# Patient Record
Sex: Male | Born: 1965 | Race: White | Hispanic: No | Marital: Married | Smoking: Current every day smoker
Health system: Southern US, Community
[De-identification: ages and names within clinical notes are randomized; demographics above are authoritative.]

## PROBLEM LIST (undated history)

## (undated) DIAGNOSIS — J449 Chronic obstructive pulmonary disease, unspecified: Secondary | ICD-10-CM

## (undated) DIAGNOSIS — E7800 Pure hypercholesterolemia, unspecified: Secondary | ICD-10-CM

## (undated) DIAGNOSIS — I209 Angina pectoris, unspecified: Secondary | ICD-10-CM

## (undated) DIAGNOSIS — Z8739 Personal history of other diseases of the musculoskeletal system and connective tissue: Secondary | ICD-10-CM

## (undated) DIAGNOSIS — K219 Gastro-esophageal reflux disease without esophagitis: Secondary | ICD-10-CM

## (undated) DIAGNOSIS — F32A Depression, unspecified: Secondary | ICD-10-CM

## (undated) DIAGNOSIS — F99 Mental disorder, not otherwise specified: Secondary | ICD-10-CM

## (undated) DIAGNOSIS — Z87442 Personal history of urinary calculi: Secondary | ICD-10-CM

## (undated) DIAGNOSIS — F319 Bipolar disorder, unspecified: Secondary | ICD-10-CM

## (undated) DIAGNOSIS — I2510 Atherosclerotic heart disease of native coronary artery without angina pectoris: Secondary | ICD-10-CM

## (undated) DIAGNOSIS — M1990 Unspecified osteoarthritis, unspecified site: Secondary | ICD-10-CM

## (undated) DIAGNOSIS — R0600 Dyspnea, unspecified: Secondary | ICD-10-CM

## (undated) DIAGNOSIS — F329 Major depressive disorder, single episode, unspecified: Secondary | ICD-10-CM

## (undated) DIAGNOSIS — R519 Headache, unspecified: Secondary | ICD-10-CM

## (undated) DIAGNOSIS — J189 Pneumonia, unspecified organism: Secondary | ICD-10-CM

## (undated) DIAGNOSIS — M75102 Unspecified rotator cuff tear or rupture of left shoulder, not specified as traumatic: Secondary | ICD-10-CM

## (undated) HISTORY — DX: Pure hypercholesterolemia, unspecified: E78.00

## (undated) HISTORY — PX: CHOLECYSTECTOMY: SHX55

## (undated) HISTORY — PX: TOOTH EXTRACTION: SUR596

## (undated) HISTORY — DX: Atherosclerotic heart disease of native coronary artery without angina pectoris: I25.10

## (undated) HISTORY — PX: VASECTOMY: SHX75

## (undated) HISTORY — DX: Bipolar disorder, unspecified: F31.9

---

## 1999-01-21 ENCOUNTER — Encounter: Payer: Self-pay | Admitting: Emergency Medicine

## 1999-01-21 ENCOUNTER — Emergency Department (HOSPITAL_COMMUNITY): Admission: EM | Admit: 1999-01-21 | Discharge: 1999-01-21 | Payer: Self-pay | Admitting: Emergency Medicine

## 2008-06-16 DIAGNOSIS — J449 Chronic obstructive pulmonary disease, unspecified: Secondary | ICD-10-CM

## 2008-06-16 DIAGNOSIS — F17200 Nicotine dependence, unspecified, uncomplicated: Secondary | ICD-10-CM

## 2008-06-16 DIAGNOSIS — K219 Gastro-esophageal reflux disease without esophagitis: Secondary | ICD-10-CM

## 2008-06-16 DIAGNOSIS — J4489 Other specified chronic obstructive pulmonary disease: Secondary | ICD-10-CM | POA: Insufficient documentation

## 2008-06-16 DIAGNOSIS — N529 Male erectile dysfunction, unspecified: Secondary | ICD-10-CM | POA: Insufficient documentation

## 2008-06-16 HISTORY — DX: Other specified chronic obstructive pulmonary disease: J44.89

## 2008-06-16 HISTORY — DX: Gastro-esophageal reflux disease without esophagitis: K21.9

## 2008-06-16 HISTORY — DX: Male erectile dysfunction, unspecified: N52.9

## 2008-06-16 HISTORY — DX: Chronic obstructive pulmonary disease, unspecified: J44.9

## 2010-06-26 ENCOUNTER — Encounter: Payer: Self-pay | Admitting: Pulmonary Disease

## 2010-06-28 ENCOUNTER — Ambulatory Visit: Admit: 2010-06-28 | Payer: Self-pay | Admitting: Internal Medicine

## 2010-07-04 ENCOUNTER — Ambulatory Visit
Admission: RE | Admit: 2010-07-04 | Discharge: 2010-07-04 | Payer: Self-pay | Source: Home / Self Care | Attending: Pulmonary Disease | Admitting: Pulmonary Disease

## 2010-07-04 DIAGNOSIS — J438 Other emphysema: Secondary | ICD-10-CM

## 2010-07-04 DIAGNOSIS — R042 Hemoptysis: Secondary | ICD-10-CM | POA: Insufficient documentation

## 2010-07-04 HISTORY — DX: Hemoptysis: R04.2

## 2010-07-04 HISTORY — DX: Other emphysema: J43.8

## 2010-07-05 ENCOUNTER — Ambulatory Visit: Admit: 2010-07-05 | Payer: Self-pay | Admitting: Internal Medicine

## 2010-07-26 NOTE — Assessment & Plan Note (Signed)
Summary: consult for hemoptysis   Visit Type:  Initial Consult Copy to:  Gwendlyn Deutscher MD Primary Provider/Referring Provider:  Gwendlyn Deutscher MD  CC:  Pulmonary consult. Pt here for hemoptysis x 3 times. Pt states a fourth of his phlem had blood in it when he coughed. pt c/o dyspnea .  History of Present Illness: The pt is a 45y/o male who I have been asked to see for hemoptysis.  The pt states he noted a dark blood clump admixed with clear mucus for the first time about 2mos ago.  This occurred in small amts, and resolved after approx one week.  About a week later, occurred again one time, with the mucus appearing a little more yellow at this time.  He has not had any chest congestion, his cxr is unremarkable, and he denies any epistaxis or nasal symptoms.  He was treated with a short course of prednisone the first week of Jan.  He denies any weight loss or chest pain, but has had a little more sob the past 3mos.  He does smoke a ppd, and has been doing so since his teenage years.  Preventive Screening-Counseling & Management  Alcohol-Tobacco     Smoking Status: current  Current Medications (verified): 1)  Proventil Hfa 108 (90 Base) Mcg/act Aers (Albuterol Sulfate) .... 2 Puffs Every 4 Hours As Needed 2)  Spiriva Handihaler 18 Mcg Caps (Tiotropium Bromide Monohydrate) .... Inhale Contents of 1 Capsule Daily 3)  Protonix 40 Mg Tbec (Pantoprazole Sodium) .Marland Kitchen.. 1 Once Daily 4)  Lamictal 150 Mg Tabs (Lamotrigine) .Marland Kitchen.. 1 Once Daily 5)  Lithium Carbonate 300 Mg Caps (Lithium Carbonate) .Marland Kitchen.. 1 Once Daily 6)  Gabapentin 300 Mg Caps (Gabapentin) .Marland Kitchen.. 1 Once Daily 7)  Protonix 40 Mg Solr (Pantoprazole Sodium) .... Once Daily 8)  Colcrys 0.6 Mg Tabs (Colchicine) .Marland Kitchen.. 1 Two Times A Day 9)  Lamotrigine 150 Mg Tabs (Lamotrigine) .Marland Kitchen.. 1 Two Times A Day 10)  Lithium Carbonate 300 Mg Caps (Lithium Carbonate) .... 2 Two Times A Day 11)  Gabapentin 300 Mg Caps (Gabapentin) .Marland Kitchen.. 1 Three Times A Day 12)   Singulair 10 Mg Tabs (Montelukast Sodium) .... Once Daily 13)  Ventolin Hfa 108 (90 Base) Mcg/act Aers (Albuterol Sulfate) .... 3 Puffs Every 4 Hrs 14)  Albuterol Sulfate (2.5 Mg/50ml) 0.083% Nebu (Albuterol Sulfate) .... Once Daily 15)  Prednisone 10 Mg Tabs (Prednisone) .... 2 Two Times A Day  Allergies (verified): 1)  ! Motrin 2)  ! Nsaids 3)  ! Pcn  Past History:  Past Medical History:    TOBACCO ABUSE (ICD-305.1) ESOPHAGEAL REFLUX (ICD-530.81)  Chronic Headaches  Past Surgical History: Cholecystectomy  Family History: Reviewed history and no changes required. emphysema: mother allergies: mother asthma: sister heart disease: mgf clotting disorder: mother mgm: lung cancer great uncle: lung cancer great uncle: lung cancer  Social History: landscaper married Patient is a current smoker. 1 ppd. started age 61 3 children Smoking Status:  current  Review of Systems       The patient complains of shortness of breath with activity, shortness of breath at rest, non-productive cough, coughing up blood, chest pain, acid heartburn, headaches, anxiety, and depression.  The patient denies productive cough, irregular heartbeats, indigestion, loss of appetite, weight change, abdominal pain, difficulty swallowing, sore throat, tooth/dental problems, nasal congestion/difficulty breathing through nose, sneezing, itching, ear ache, hand/feet swelling, joint stiffness or pain, rash, change in color of mucus, and fever.    Vital Signs:  Patient profile:  45 year old male Height:      72 inches Weight:      180.50 pounds BMI:     24.57 O2 Sat:      100 % on Room air Temp:     98.3 degrees F oral Pulse rate:   64 / minute BP sitting:   120 / 70  (left arm) Cuff size:   large  Vitals Entered By: Carver Fila (July 04, 2010 9:15 AM)  O2 Flow:  Room air CC: Pulmonary consult. Pt here for hemoptysis x 3 times. Pt states a fourth of his phlem had blood in it when he coughed. pt  c/o dyspnea  Comments meds and allergies updated Phone number updated Mindy Silva  July 04, 2010 9:15 AM    Physical Exam  General:  wd male in nad Eyes:  PERRLA and EOMI.   Nose:  narrowed bilat, mildly inflammed but no purulence or blood Mouth:  no lesions or other abnormality seen no obvious bleeding source Neck:  no jvd, tmg, LN Lungs:  mild decrease in bs, no wheezing or rhonchi Heart:  rrr, no mrg Abdomen:  soft and nontender, bs+ Extremities:  no edema or cyanosis pulses intact distally Neurologic:  alert and oriented, moves all 4.   Impression & Recommendations:  Problem # 1:  HEMOPTYSIS UNSPECIFIED (ICD-786.30)  the pt has very mild hemoptysis by his description over the last few mos that has been intermittant in nature.  The most common causes of this are nasal/upper airway sources, and acute bronchitis.  He denies any sinus issues, and I do not see any nasal or OP bleeding currently.  He has not been treated with abx, and would like to go ahead and do this while he is completing his prednisone taper.  I have stressed to him the role of smoking on airway inflammation, and that no medicine will keep him well if he does not quit smoking.  I have also told him that cxr's can miss abnormalities in the lung and airways, and would do a ct chest with possible bronchoscopic airway exam if he has a recurrrence after being treated with antibiotics.  He is to let me know if this does occur.  Medications Added to Medication List This Visit: 1)  Protonix 40 Mg Solr (Pantoprazole sodium) .... Once daily 2)  Colcrys 0.6 Mg Tabs (Colchicine) .Marland Kitchen.. 1 two times a day 3)  Lamotrigine 150 Mg Tabs (Lamotrigine) .Marland Kitchen.. 1 two times a day 4)  Lithium Carbonate 300 Mg Caps (Lithium carbonate) .... 2 two times a day 5)  Gabapentin 300 Mg Caps (Gabapentin) .Marland Kitchen.. 1 three times a day 6)  Singulair 10 Mg Tabs (Montelukast sodium) .... Once daily 7)  Ventolin Hfa 108 (90 Base) Mcg/act Aers (Albuterol  sulfate) .... 3 puffs every 4 hrs 8)  Albuterol Sulfate (2.5 Mg/19ml) 0.083% Nebu (Albuterol sulfate) .... Once daily 9)  Prednisone 10 Mg Tabs (Prednisone) .... 2 two times a day 10)  Avelox 400 Mg Tabs (Moxifloxacin hcl) .... By mouth daily  Other Orders: Consultation Level IV (04540)  Patient Instructions: 1)  stop smoking!! this is the most important treatment 2)  finish up prednisone taper 3)  avelox 400mg  one a day for 5 days. 4)  If the bloody mucus does not resolve, or if it recurs in a few weeks, I need to hear from you.  Prescriptions: AVELOX 400 MG  TABS (MOXIFLOXACIN HCL) By mouth daily  #3 x 0   Entered and  Authorized by:   Barbaraann Share MD   Signed by:   Barbaraann Share MD on 07/04/2010   Method used:   Print then Give to Patient   RxID:   [id number]

## 2011-09-18 ENCOUNTER — Other Ambulatory Visit: Payer: Self-pay | Admitting: Orthopedic Surgery

## 2011-09-18 ENCOUNTER — Encounter (HOSPITAL_COMMUNITY): Payer: Self-pay | Admitting: Pharmacy Technician

## 2011-09-23 ENCOUNTER — Encounter (HOSPITAL_COMMUNITY): Payer: Self-pay | Admitting: *Deleted

## 2011-09-23 NOTE — Pre-Procedure Instructions (Signed)
20 Richard Sanford  09/23/2011   Your procedure is scheduled on:  09/26/11  Report to SHORT STAY DEPT  at 7:00 AM.  Call this number if you have problems the morning of surgery: [phone]   Remember:   Do not eat food or drink liquids AFTER MIDNIGHT    Take these medicines the morning of surgery with A SIP OF WATER: lamictal / lithium / protonix / albuterol inhaler if needed   Do not wear jewelry, make-up or nail polish.  Do not wear lotions, powders, or perfumes.   Do not shave legs or underarms 48 hrs. before surgery (men may shave face)  Do not bring valuables to the hospital.  Contacts, dentures or bridgework may not be worn into surgery.  Leave suitcase in the car. After surgery it may be brought to your room.  For patients admitted to the hospital, checkout time is 11:00 AM the day of discharge.   Patients discharged the day of surgery will not be allowed to drive home.  Name and phone number of your driver:   Special Instructions:   Please read over the following fact sheets that you were given: MRSA  Information               SHOWER WITH BETASEPT THE NIGHT BEFORE SURGERY AND THE MORNING OF SURGERY

## 2011-09-26 ENCOUNTER — Other Ambulatory Visit: Payer: Self-pay

## 2011-09-26 ENCOUNTER — Ambulatory Visit (HOSPITAL_COMMUNITY): Payer: BC Managed Care – PPO | Admitting: Anesthesiology

## 2011-09-26 ENCOUNTER — Ambulatory Visit (HOSPITAL_COMMUNITY): Payer: BC Managed Care – PPO

## 2011-09-26 ENCOUNTER — Encounter (HOSPITAL_COMMUNITY)
Admission: RE | Disposition: A | Discharge status: Home / Self Care | Payer: Self-pay | Source: Ambulatory Visit | Attending: Orthopedic Surgery

## 2011-09-26 ENCOUNTER — Encounter (HOSPITAL_COMMUNITY): Payer: Self-pay | Admitting: *Deleted

## 2011-09-26 ENCOUNTER — Encounter (HOSPITAL_COMMUNITY): Payer: Self-pay | Admitting: Anesthesiology

## 2011-09-26 ENCOUNTER — Ambulatory Visit (HOSPITAL_COMMUNITY)
Admission: RE | Admit: 2011-09-26 | Discharge: 2011-09-26 | Disposition: A | Discharge status: Home / Self Care | Payer: BC Managed Care – PPO | Source: Ambulatory Visit | Attending: Orthopedic Surgery | Admitting: Orthopedic Surgery

## 2011-09-26 DIAGNOSIS — Z01818 Encounter for other preprocedural examination: Secondary | ICD-10-CM | POA: Insufficient documentation

## 2011-09-26 DIAGNOSIS — J4489 Other specified chronic obstructive pulmonary disease: Secondary | ICD-10-CM | POA: Insufficient documentation

## 2011-09-26 DIAGNOSIS — Z01812 Encounter for preprocedural laboratory examination: Secondary | ICD-10-CM | POA: Insufficient documentation

## 2011-09-26 DIAGNOSIS — S43429A Sprain of unspecified rotator cuff capsule, initial encounter: Secondary | ICD-10-CM | POA: Insufficient documentation

## 2011-09-26 DIAGNOSIS — M25819 Other specified joint disorders, unspecified shoulder: Secondary | ICD-10-CM | POA: Insufficient documentation

## 2011-09-26 DIAGNOSIS — J449 Chronic obstructive pulmonary disease, unspecified: Secondary | ICD-10-CM | POA: Insufficient documentation

## 2011-09-26 DIAGNOSIS — R0602 Shortness of breath: Secondary | ICD-10-CM | POA: Insufficient documentation

## 2011-09-26 DIAGNOSIS — X58XXXA Exposure to other specified factors, initial encounter: Secondary | ICD-10-CM | POA: Insufficient documentation

## 2011-09-26 DIAGNOSIS — M75122 Complete rotator cuff tear or rupture of left shoulder, not specified as traumatic: Secondary | ICD-10-CM

## 2011-09-26 DIAGNOSIS — J3489 Other specified disorders of nose and nasal sinuses: Secondary | ICD-10-CM | POA: Insufficient documentation

## 2011-09-26 HISTORY — DX: Unspecified rotator cuff tear or rupture of left shoulder, not specified as traumatic: M75.102

## 2011-09-26 HISTORY — DX: Mental disorder, not otherwise specified: F99

## 2011-09-26 HISTORY — DX: Chronic obstructive pulmonary disease, unspecified: J44.9

## 2011-09-26 HISTORY — DX: Personal history of other diseases of the musculoskeletal system and connective tissue: Z87.39

## 2011-09-26 HISTORY — DX: Gastro-esophageal reflux disease without esophagitis: K21.9

## 2011-09-26 HISTORY — PX: SHOULDER OPEN ROTATOR CUFF REPAIR: SHX2407

## 2011-09-26 HISTORY — DX: Depression, unspecified: F32.A

## 2011-09-26 HISTORY — DX: Angina pectoris, unspecified: I20.9

## 2011-09-26 HISTORY — DX: Major depressive disorder, single episode, unspecified: F32.9

## 2011-09-26 LAB — CBC
HCT: 39.4 % (ref 39.0–52.0)
Hemoglobin: 13.2 g/dL (ref 13.0–17.0)
MCHC: 33.5 g/dL (ref 30.0–36.0)
MCV: 97.5 fL (ref 78.0–100.0)
RDW: 12.3 % (ref 11.5–15.5)

## 2011-09-26 LAB — SURGICAL PCR SCREEN: Staphylococcus aureus: POSITIVE — AB

## 2011-09-26 SURGERY — REPAIR, ROTATOR CUFF, OPEN
Anesthesia: General | Site: Shoulder | Laterality: Left | Wound class: Clean

## 2011-09-26 MED ORDER — ACETAMINOPHEN 10 MG/ML IV SOLN
INTRAVENOUS | Status: DC | PRN
  Administered 2011-09-26: 1000 mg via INTRAVENOUS

## 2011-09-26 MED ORDER — CEFAZOLIN SODIUM-DEXTROSE 2-3 GM-% IV SOLR
2.0000 g | INTRAVENOUS | Status: AC
  Administered 2011-09-26: 2 g via INTRAVENOUS

## 2011-09-26 MED ORDER — MUPIROCIN 2 % EX OINT
TOPICAL_OINTMENT | CUTANEOUS | Status: AC
  Filled 2011-09-26: qty 22

## 2011-09-26 MED ORDER — LIDOCAINE HCL (CARDIAC) 20 MG/ML IV SOLN
INTRAVENOUS | Status: DC | PRN
  Administered 2011-09-26: 50 mg via INTRAVENOUS

## 2011-09-26 MED ORDER — ACETAMINOPHEN 10 MG/ML IV SOLN
INTRAVENOUS | Status: AC
  Filled 2011-09-26: qty 100

## 2011-09-26 MED ORDER — PROMETHAZINE HCL 25 MG/ML IJ SOLN
6.2500 mg | INTRAMUSCULAR | Status: DC | PRN
  Administered 2011-09-26: 6.25 mg via INTRAVENOUS

## 2011-09-26 MED ORDER — BUPIVACAINE-EPINEPHRINE PF 0.25-1:200000 % IJ SOLN
INTRAMUSCULAR | Status: AC
  Filled 2011-09-26: qty 30

## 2011-09-26 MED ORDER — MUPIROCIN 2 % EX OINT: TOPICAL_OINTMENT | Freq: Two times a day (BID) | CUTANEOUS | Status: DC

## 2011-09-26 MED ORDER — 0.9 % SODIUM CHLORIDE (POUR BTL) OPTIME
TOPICAL | Status: DC | PRN
  Administered 2011-09-26: 1000 mL

## 2011-09-26 MED ORDER — PROPOFOL 10 MG/ML IV EMUL
INTRAVENOUS | Status: DC | PRN
  Administered 2011-09-26: 200 mg via INTRAVENOUS

## 2011-09-26 MED ORDER — NEOSTIGMINE METHYLSULFATE 1 MG/ML IJ SOLN
INTRAMUSCULAR | Status: DC | PRN
  Administered 2011-09-26: 4 mg via INTRAVENOUS

## 2011-09-26 MED ORDER — ROPIVACAINE HCL 5 MG/ML IJ SOLN
INTRAMUSCULAR | Status: DC | PRN
  Administered 2011-09-26: 30 mL

## 2011-09-26 MED ORDER — ROCURONIUM BROMIDE 100 MG/10ML IV SOLN
INTRAVENOUS | Status: DC | PRN
  Administered 2011-09-26: 40 mg via INTRAVENOUS

## 2011-09-26 MED ORDER — ONDANSETRON HCL 4 MG/2ML IJ SOLN
INTRAMUSCULAR | Status: DC | PRN
  Administered 2011-09-26: 4 mg via INTRAVENOUS

## 2011-09-26 MED ORDER — POVIDONE-IODINE 7.5 % EX SOLN: Freq: Once | CUTANEOUS | Status: DC

## 2011-09-26 MED ORDER — ROPIVACAINE HCL 5 MG/ML IJ SOLN
INTRAMUSCULAR | Status: AC
  Filled 2011-09-26: qty 30

## 2011-09-26 MED ORDER — MIDAZOLAM HCL 5 MG/5ML IJ SOLN
INTRAMUSCULAR | Status: DC | PRN
  Administered 2011-09-26: 2 mg via INTRAVENOUS

## 2011-09-26 MED ORDER — HYDROMORPHONE HCL PF 1 MG/ML IJ SOLN: 0.2500 mg | INTRAMUSCULAR | Status: DC | PRN

## 2011-09-26 MED ORDER — CEFAZOLIN SODIUM-DEXTROSE 2-3 GM-% IV SOLR
INTRAVENOUS | Status: AC
  Filled 2011-09-26: qty 50

## 2011-09-26 MED ORDER — PROMETHAZINE HCL 25 MG/ML IJ SOLN
INTRAMUSCULAR | Status: AC
  Filled 2011-09-26: qty 1

## 2011-09-26 MED ORDER — FENTANYL CITRATE 0.05 MG/ML IJ SOLN
INTRAMUSCULAR | Status: DC | PRN
  Administered 2011-09-26: 50 ug via INTRAVENOUS
  Administered 2011-09-26: 100 ug via INTRAVENOUS

## 2011-09-26 MED ORDER — TRAMADOL HCL 50 MG PO TABS: 50.0000 mg | ORAL_TABLET | Freq: Four times a day (QID) | ORAL | Status: AC | PRN

## 2011-09-26 MED ORDER — OXYCODONE-ACETAMINOPHEN 7.5-325 MG PO TABS: 1.0000 | ORAL_TABLET | ORAL | Status: AC | PRN

## 2011-09-26 MED ORDER — GLYCOPYRROLATE 0.2 MG/ML IJ SOLN
INTRAMUSCULAR | Status: DC | PRN
  Administered 2011-09-26: 0.6 mg via INTRAVENOUS

## 2011-09-26 MED ORDER — DEXAMETHASONE SODIUM PHOSPHATE 10 MG/ML IJ SOLN
INTRAMUSCULAR | Status: DC | PRN
  Administered 2011-09-26: 10 mg via INTRAVENOUS

## 2011-09-26 MED ORDER — LACTATED RINGERS IV SOLN
INTRAVENOUS | Status: DC | PRN
  Administered 2011-09-26 (×2): via INTRAVENOUS

## 2011-09-26 MED ORDER — METHOCARBAMOL 500 MG PO TABS: 500.0000 mg | ORAL_TABLET | Freq: Four times a day (QID) | ORAL | Status: AC

## 2011-09-26 MED ORDER — LACTATED RINGERS IV SOLN: INTRAVENOUS | Status: DC

## 2011-09-26 SURGICAL SUPPLY — 40 items
ANCHOR PEEK ZIP 5.5 NDL NO2 (Orthopedic Implant) ×2 IMPLANT
BAG ZIPLOCK 12X15 (MISCELLANEOUS) ×2 IMPLANT
BLADE OSCILLATING/SAGITTAL (BLADE) ×1
BLADE SW THK.38XMED LNG THN (BLADE) ×1 IMPLANT
BUR OVAL CARBIDE 4.0 (BURR) ×2 IMPLANT
CLOTH BEACON ORANGE TIMEOUT ST (SAFETY) ×2 IMPLANT
COVER SURGICAL LIGHT HANDLE (MISCELLANEOUS) IMPLANT
DECANTER SPIKE VIAL GLASS SM (MISCELLANEOUS) IMPLANT
DRAPE LG THREE QUARTER DISP (DRAPES) ×2 IMPLANT
DRAPE U-SHAPE 47X51 STRL (DRAPES) ×4 IMPLANT
DRSG EMULSION OIL 3X3 NADH (GAUZE/BANDAGES/DRESSINGS) ×2 IMPLANT
DRSG PAD ABDOMINAL 8X10 ST (GAUZE/BANDAGES/DRESSINGS) ×2 IMPLANT
DURAPREP 26ML APPLICATOR (WOUND CARE) ×2 IMPLANT
ELECT REM PT RETURN 9FT ADLT (ELECTROSURGICAL) ×2
ELECTRODE REM PT RTRN 9FT ADLT (ELECTROSURGICAL) ×1 IMPLANT
FACESHIELD LNG OPTICON STERILE (SAFETY) ×4 IMPLANT
GLOVE BIOGEL M 8.0 STRL (GLOVE) ×2 IMPLANT
GLOVE ECLIPSE 8.0 STRL XLNG CF (GLOVE) ×2 IMPLANT
GLOVE INDICATOR 8.0 STRL GRN (GLOVE) ×6 IMPLANT
GOWN PREVENTION PLUS XLARGE (GOWN DISPOSABLE) ×2 IMPLANT
GOWN STRL REIN XL XLG (GOWN DISPOSABLE) ×4 IMPLANT
KIT BASIN OR (CUSTOM PROCEDURE TRAY) ×2 IMPLANT
MANIFOLD NEPTUNE II (INSTRUMENTS) ×2 IMPLANT
NEEDLE MA TROC 1/2 (NEEDLE) ×2 IMPLANT
NS IRRIG 1000ML POUR BTL (IV SOLUTION) ×2 IMPLANT
PACK SHOULDER CUSTOM OPM052 (CUSTOM PROCEDURE TRAY) ×2 IMPLANT
PASSER SUT SWANSON 36MM LOOP (INSTRUMENTS) IMPLANT
POSITIONER SURGICAL ARM (MISCELLANEOUS) ×2 IMPLANT
SLING ARM IMMOBILIZER LRG (SOFTGOODS) ×2 IMPLANT
SPONGE GAUZE 4X4 12PLY (GAUZE/BANDAGES/DRESSINGS) ×2 IMPLANT
SPONGE LAP 4X18 X RAY DECT (DISPOSABLE) ×2 IMPLANT
SPONGE SURGIFOAM ABS GEL 100 (HEMOSTASIS) IMPLANT
STAPLER VISISTAT 35W (STAPLE) ×2 IMPLANT
SUT BONE WAX W31G (SUTURE) ×2 IMPLANT
SUT ETHIBOND NAB CT1 #1 30IN (SUTURE) ×6 IMPLANT
SUT VIC AB 1 CT1 27 (SUTURE) ×2
SUT VIC AB 1 CT1 27XBRD ANTBC (SUTURE) ×2 IMPLANT
SUT VIC AB 2-0 CT1 27 (SUTURE) ×2
SUT VIC AB 2-0 CT1 27XBRD (SUTURE) ×2 IMPLANT
TAPE CLOTH SURG 4X10 WHT LF (GAUZE/BANDAGES/DRESSINGS) ×2 IMPLANT

## 2011-09-26 NOTE — H&P (Signed)
Richard Sanford is an 46 y.o. male.   Chief Complaint: painful lt shoulder [id number] lt shoulder since early Feb with mri demonstrating rotator cuff tear with retraction  Past Medical History  Diagnosis Date  . Angina   . GERD (gastroesophageal reflux disease)   . Asthma   . Mental disorder     bipolar  . Depression   . Rotator cuff tear, left   . H/O: gout   . COPD (chronic obstructive pulmonary disease)     Past Surgical History  Procedure Date  . Cholecystectomy   . Vasectomy   . Tooth extraction     History reviewed. No pertinent family history. Social History:  reports that he has been smoking.  He does not have any smokeless tobacco history on file. He reports that he does not drink alcohol or use illicit drugs.  Allergies:  Allergies  Allergen Reactions  . Ibuprofen Other (See Comments)    Ulcers  . Nsaids Other (See Comments)    Ulcers   . Penicillins Swelling and Rash    Medications Prior to Admission  Medication Dose Route Frequency Provider Last Rate Last Dose  . ceFAZolin (ANCEF) IVPB 2 g/50 mL premix  2 g Intravenous 60 min Pre-Op Illene Labrador Jeany Seville, MD      . mupirocin ointment (BACTROBAN) 2 %   Nasal BID Illene Labrador Omolola Mittman, MD      . mupirocin ointment (BACTROBAN) 2 %           . povidone-iodine (BETADINE) 7.5 % scrub   Topical Once Drucilla Schmidt, MD       Medications Prior to Admission  Medication Sig Dispense Refill  . HYDROcodone-acetaminophen (NORCO) 5-325 MG per tablet Take 1 tablet by mouth every 6 (six) hours as needed.        Results for orders placed during the hospital encounter of 09/26/11 (from the past 48 hour(s))  CBC     Status: Abnormal   Collection Time   09/26/11  8:20 AM      Component Value Range Comment   WBC 4.1  4.0 - 10.5 (K/uL)    RBC 4.04 (*) 4.22 - 5.81 (MIL/uL)    Hemoglobin 13.2  13.0 - 17.0 (g/dL)    HCT 09.8  11.9 - 14.7 (%)    MCV 97.5  78.0 - 100.0 (fL)    MCH 32.7  26.0 - 34.0 (pg)    MCHC 33.5  30.0 -  36.0 (g/dL)    RDW 82.9  56.2 - 13.0 (%)    Platelets 198  150 - 400 (K/uL)    Dg Chest 2 View  09/26/2011  *RADIOLOGY REPORT*  Clinical Data: Preop evaluation for left shoulder surgery, shortness of breath and congestion  CHEST - 2 VIEW  Comparison:  None.  Findings:  The heart size and mediastinal contours are within normal limits.  Both lungs are clear.  The visualized skeletal structures are unremarkable.  IMPRESSION: No active cardiopulmonary disease.  Original Report Authenticated By: Judie Petit. TREVOR Miles Costain, M.D.    ROS  Blood pressure 124/74, pulse 69, temperature 97.2 F (36.2 C), temperature source Oral, resp. rate 18, height 6\' 2"  (1.88 m), weight 80.06 kg (176 lb 8 oz), SpO2 100.00%. Physical Exam  Constitutional: He is oriented to person, place, and time. He appears well-developed and well-nourished.  HENT:  Head: Normocephalic and atraumatic.  Right Ear: External ear normal.  Left Ear: External ear normal.  Nose: Nose normal.  Mouth/Throat: Oropharynx is clear  and moist.  Eyes: Conjunctivae and EOM are normal. Pupils are equal, round, and reactive to light.  Neck: Normal range of motion. Neck supple.  Cardiovascular: Normal rate, regular rhythm, normal heart sounds and intact distal pulses.   Respiratory: Effort normal and breath sounds normal.  GI: Soft. Bowel sounds are normal.  Musculoskeletal: Normal range of motion. He exhibits tenderness.       Diffuse tenderness about lt shoulder  Neurological: He is alert and oriented to person, place, and time. He has normal reflexes.  Skin: Skin is warm and dry.  Psychiatric: He has a normal mood and affect. His behavior is normal. Judgment and thought content normal.     Assessment/Plan Torn rotator cuff lt shoulder Anterior acromionectomy with repair torn rotator cuff Lulie Hurd P 09/26/2011, 9:26 AM

## 2011-09-26 NOTE — Anesthesia Preprocedure Evaluation (Addendum)
Anesthesia Evaluation  Patient identified by MRN, date of birth, ID band Patient awake    Reviewed: Allergy & Precautions, H&P , NPO status , Patient's Chart, lab work & pertinent test results  Airway Mallampati: II TM Distance: >3 FB Neck ROM: Full    Dental  (+) Edentulous Upper and Edentulous Lower   Pulmonary asthma , COPD COPD inhaler,  breath sounds clear to auscultation  + wheezing      Cardiovascular + angina Rhythm:Regular Rate:Normal     Neuro/Psych PSYCHIATRIC DISORDERS Depression negative neurological ROS     GI/Hepatic Neg liver ROS, GERD-  ,  Endo/Other  negative endocrine ROS  Renal/GU negative Renal ROS  negative genitourinary   Musculoskeletal negative musculoskeletal ROS (+)   Abdominal   Peds negative pediatric ROS (+)  Hematology negative hematology ROS (+)   Anesthesia Other Findings   Reproductive/Obstetrics negative OB ROS                         Anesthesia Physical Anesthesia Plan  ASA: III  Anesthesia Plan: General   Post-op Pain Management:    Induction: Intravenous  Airway Management Planned: Oral ETT  Additional Equipment:   Intra-op Plan:   Post-operative Plan: Extubation in OR  Informed Consent: I have reviewed the patients History and Physical, chart, labs and discussed the procedure including the risks, benefits and alternatives for the proposed anesthesia with the patient or authorized representative who has indicated his/her understanding and acceptance.   Dental advisory given  Plan Discussed with: CRNA  Anesthesia Plan Comments: (Discussed risks and benefits of interscalene block including failure, bleeding, infection, nerve damage, weakness. Questions answered. Patient consents to block. Discussed possible temporary dyspnea due to phrenic nerve block. Patient consents.)       Anesthesia Quick Evaluation

## 2011-09-26 NOTE — Brief Op Note (Signed)
09/26/2011  11:15 AM  PATIENT:  Richard Sanford  46 y.o. male  PRE-OPERATIVE DIAGNOSIS:  left shoulder rotator cuff tear  POST-OPERATIVE DIAGNOSIS:  left shoulder rotator cuff tear  PROCEDURE:  Procedure(s) (LRB): ROTATOR CUFF REPAIR SHOULDER OPEN (Left) with anterior acromionectomy  SURGEON:  Surgeon(s) and Role:    * Drucilla Schmidt, MD - Primary  PHYSICIAN ASSISTANT:   ASSISTANTS:Mr Idolina Primer [facility]  ANESTHESIA:   regional and general  EBL:  Total I/O In: 1200 [I.V.:1200] Out: 10 [Blood:10]  BLOOD ADMINISTERED:none  DRAINS: none   LOCAL MEDICATIONS USED:  NONE  SPECIMEN:  No Specimen  DISPOSITION OF SPECIMEN:  N/A  COUNTS:  YES  TOURNIQUET:  * No tourniquets in log *  DICTATION: .Other Dictation: Dictation Number [id number]  PLAN OF CARE: Discharge to home after PACU  PATIENT DISPOSITION:  PACU - hemodynamically stable.   Delay start of Pharmacological VTE agent (>24hrs) due to surgical blood loss or risk of bleeding: not applicable

## 2011-09-26 NOTE — Discharge Instructions (Signed)
Shoulder Arthroscopy Because the shoulder is one of the most mobile joints, it is more prone to injury. It is a very shallow ball and socket joint located between the large bone in your upper arm (humerus) and the shoulder blade (scapula). Arthroscopy is a valuable test for evaluating and treating injuries involving the shoulder joint. Arthroscopy is a surgical technique which uses small incisions (cuts by the surgeon) to insert a small telescope like instrument (arthroscope) and other tools into the shoulder. This allows the surgeon to look directly at the problem. When the arthroscope is in the joint, fluid is used to expand the joint space. This allows the surgeon to examine it more easily. The arthroscope then beams light into the joint and sends an image to a TV screen. As your surgeon examines your shoulder, he or she can also repair a number of problems found at the same time. Sometimes the procedure may change to an open surgery. This would happen if the problems are severe enough that they cannot be corrected with just arthroscopy. This is usually a very safe surgery. Rare complications include damage to nerves or blood vessels, excess bleeding, blood clots, infection, and rarely instrument failure. This is most often performed as a same day surgery. This means you will not have to stay in the hospital overnight. Recovery from this surgery is also much faster than having an open procedure. LET YOUR CAREGIVER KNOW ABOUT:  Allergies.   Medications taken including herbs, eye drops, over the counter medications, and creams.   Use of steroids (by mouth or creams).   Previous problems with anesthetics or novocaine.   Possibility of pregnancy, if this applies.   History of blood clots (thrombophlebitis).   History of bleeding or blood problems.   Previous surgery.   Other health problems.   Family history of anesthetic problems.  BEFORE THE PROCEDURE   Stop all anti-inflammatory  medications at least one week before surgery unless instructed otherwise. Tell your surgeon if you have been taking cortisone or other steroids.   Do not eat or drink after midnight or as instructed. Take medications as directed by your caregiver. You may have lab tests the morning of surgery.   You should be present 60 minutes prior to your procedure or as directed.  PROCEDURE  You may have general (go to sleep) or local (numb the area) anesthetic. Your surgeon will discuss this with you. During the procedure as discussed above, your surgeon may find a variety of problems which he or she can improve or correct using small instruments. When the procedure is finished the tiny incisions will be closed with stitches or tape. AFTER YOUR PROCEDURE  After surgery you will be taken to the recovery area. A nurse will watch and check your progress. Once you are awake, stable, and taking fluids well, barring other problems you will be allowed to go home.   Once home, apply an ice pack to your operative site for twenty minutes, three to four times per day, for two to three days. This may help with discomfort and keep the swelling down.   Use a sling and medications if prescribed or as instructed.   Unless your caregiver advises otherwise, move your arm and shoulder gently and frequently following the procedure. This can help prevent stiffness and swelling.  REHABILITATION  Almost as important as your surgery is your rehabilitation. If physical therapy and exercises are prescribed by your surgeon, follow them diligently. Once comfortable and on your way  to full use, do muscle strengthening exercises as instructed.   Only take over-the-counter or prescription medicines for pain, discomfort, or fever as directed by your caregiver.  SEEK IMMEDIATE MEDICAL CARE IF:   There is redness, swelling, or increasing pain in the wound or joint.   You notice purulent (colored- pus-like) drainage coming from the  wound.   An unexplained oral temperature above 102 F (38.9 C) develops.   You notice a foul smell coming from the wound or dressing.   There is a breaking open of the wound. The edges do not stay together after sutures or tape has been removed.   Persistent bleeding from the small incision.  Document Released: 06/07/2000 Document Revised: 05/30/2011 Document Reviewed: 09/26/2008 [facility] Patient Information 2012 [place], [place].  Call office for  1  Week appt. No drinking alcohol while taking pain meds. No driving today or until after MD follow up appt. Do not shower until after follow up appt. Take RX as prescribed

## 2011-09-26 NOTE — Preoperative (Signed)
Beta Blockers   Reason not to administer Beta Blockers:Not Applicable 

## 2011-09-26 NOTE — Anesthesia Procedure Notes (Signed)
Anesthesia Regional Block:  Interscalene brachial plexus block  Pre-Anesthetic Checklist: ,, timeout performed, Correct Patient, Correct Site, Correct Laterality, Correct Procedure, Correct Position, site marked, Risks and benefits discussed,  Surgical consent,  Pre-op evaluation,  At surgeon's request and post-op pain management  Laterality: Left  Prep: chloraprep       Needles:  Injection technique: Single-shot  Needle Type: Stimiplex      Needle Gauge: 21 G    Additional Needles:  Procedures: ultrasound guided and nerve stimulator Interscalene brachial plexus block  Nerve Stimulator or Paresthesia:  Response: 0.5 mA,   Additional Responses:   Narrative:   Performed by: Personally  Anesthesiologist: Xian Apostol  Additional Notes:  No pain on injection. No increased resistance to injection.  Motor intact immediately after block. Loss of deltoid function at 20 minutes.     Interscalene brachial plexus block

## 2011-09-26 NOTE — Anesthesia Postprocedure Evaluation (Signed)
  Anesthesia Post-op Note  Patient: Richard Sanford  Procedure(s) Performed: Procedure(s) (LRB): ROTATOR CUFF REPAIR SHOULDER OPEN (Left)  Patient Location: PACU  Anesthesia Type: GA combined with regional for post-op pain  Level of Consciousness: awake and alert   Airway and Oxygen Therapy: Patient Spontanous Breathing  Post-op Pain: mild  Post-op Assessment: Post-op Vital signs reviewed, Patient's Cardiovascular Status Stable, Respiratory Function Stable, Patent Airway and No signs of Nausea or vomiting  Post-op Vital Signs: stable  Complications: No apparent anesthesia complications

## 2011-09-26 NOTE — Transfer of Care (Signed)
Immediate Anesthesia Transfer of Care Note  Patient: Richard Sanford  Procedure(s) Performed: Procedure(s) (LRB): ROTATOR CUFF REPAIR SHOULDER OPEN (Left)  Patient Location: PACU  Anesthesia Type: General  Level of Consciousness: awake, alert  and sedated  Airway & Oxygen Therapy: Patient Spontanous Breathing and Patient connected to face mask oxygen  Post-op Assessment: Report given to PACU RN and Post -op Vital signs reviewed and stable  Post vital signs: Reviewed and stable  Complications: No apparent anesthesia complications

## 2011-09-27 NOTE — Op Note (Signed)
NAME:  Richard Sanford, Richard Sanford NO.:  [id number]  MEDICAL RECORD NO.:  [id number]  LOCATION:  [facility]                         FACILITY:  [facility]  PHYSICIAN:  Marlowe Kays, M.D.  DATE OF BIRTH:  1966-01-04  DATE OF PROCEDURE:  09/26/2011 DATE OF DISCHARGE:  09/26/2011                              OPERATIVE REPORT   PREOPERATIVE DIAGNOSIS:  Torn rotator cuff, left shoulder.  POSTOPERATIVE DIAGNOSIS:  Torn rotator cuff, left shoulder.  OPERATION:  Anterior acromionectomy and repair of torn rotator cuff, left shoulder.  SURGEON:  Marlowe Kays, M.D.  ASSISTANTDruscilla Brownie. Cherlynn June.  ANESTHESIA:  General preceded by interscalene block by anesthesiologist.  PATHOLOGY AND JUSTIFICATION FOR PROCEDURE:  Painful shoulder since early February with an MRI demonstrating a 15 x 18 mm full-thickness rotator cuff tear.  At surgery, it measured about this with 15 mm from front to back and 20 mm of retraction with some underneath lamination and additional retraction.  Mr. Angie Fava assistance was required to help hold and rotate the arm and assist in retraction.  PROCEDURE IN DETAIL:  Interscalene block by anesthesiologist, satisfied general anesthesia, beach-chair position on the sliding frame, left shoulder girdle was prepped with DuraPrep, draped in a sterile field. Ioban employed.  Time-out performed.  I made a vertical incision from roughly the [facility] joint down slightly past the acromion.  Incision was carried down to the underlying acromion and cutting cautery, I opened the fascia over it, dissecting it anteriorly and posteriorly to expose the anterior acromion.  I undermined it with a small Cobb elevator followed by larger Cobb elevator and I made my first of several passes with a micro saw to relieve the impingement.  He did have a significant impingement problem, and I kept removing underneath surface bone until the impingement problem was corrected.  The tear was  noted.  I roughened up the articular cartilage just superior to the greater tuberosity and I used a 5.5 Stryker 4 stranded anchor, which with the 4 strands spaced along the tear.  Also, because of the delamination, I retracted distally that is laterally, the underneath fibers as well, holding them with hemostat as I passed the sutures through full thicknesses of rotator cuff tear.  With the arm abducted, I made my initial tie-down of the strands and then made additional lateral tie-downs to the lateral humeral tissue, which gave a nice double layer smooth repair, which was stable with his arm to his side.  I irrigated the wound with sterile saline and closed this in 1 cm to 2 cm separation of the deltoid muscle with interrupted #1 Vicryl as well as the fascia over the anterior acromion with the same.  Subcutaneous tissue was closed with 2-0 Vicryl staples in the skin.  Betadine, Adaptic, dry sterile dressing, shoulder immobilizer applied.  He tolerated the procedure well, was taken to the recovery room in satisfactory condition with no known complications.  I did use prophylactic antibiotics.          ______________________________ Marlowe Kays, M.D.     JA/MEDQ  D:  09/26/2011  T:  09/27/2011  Job:  [id number]

## 2011-10-11 ENCOUNTER — Encounter (HOSPITAL_COMMUNITY): Payer: Self-pay | Admitting: Orthopedic Surgery

## 2013-08-04 IMAGING — CR DG CHEST 2V
2 series · 2 of 2 positions shown · non-contrast
Comparison: None.

CLINICAL DATA: Preop evaluation for left shoulder surgery,
shortness of breath and congestion

CHEST - 2 VIEW

[w chest pa]
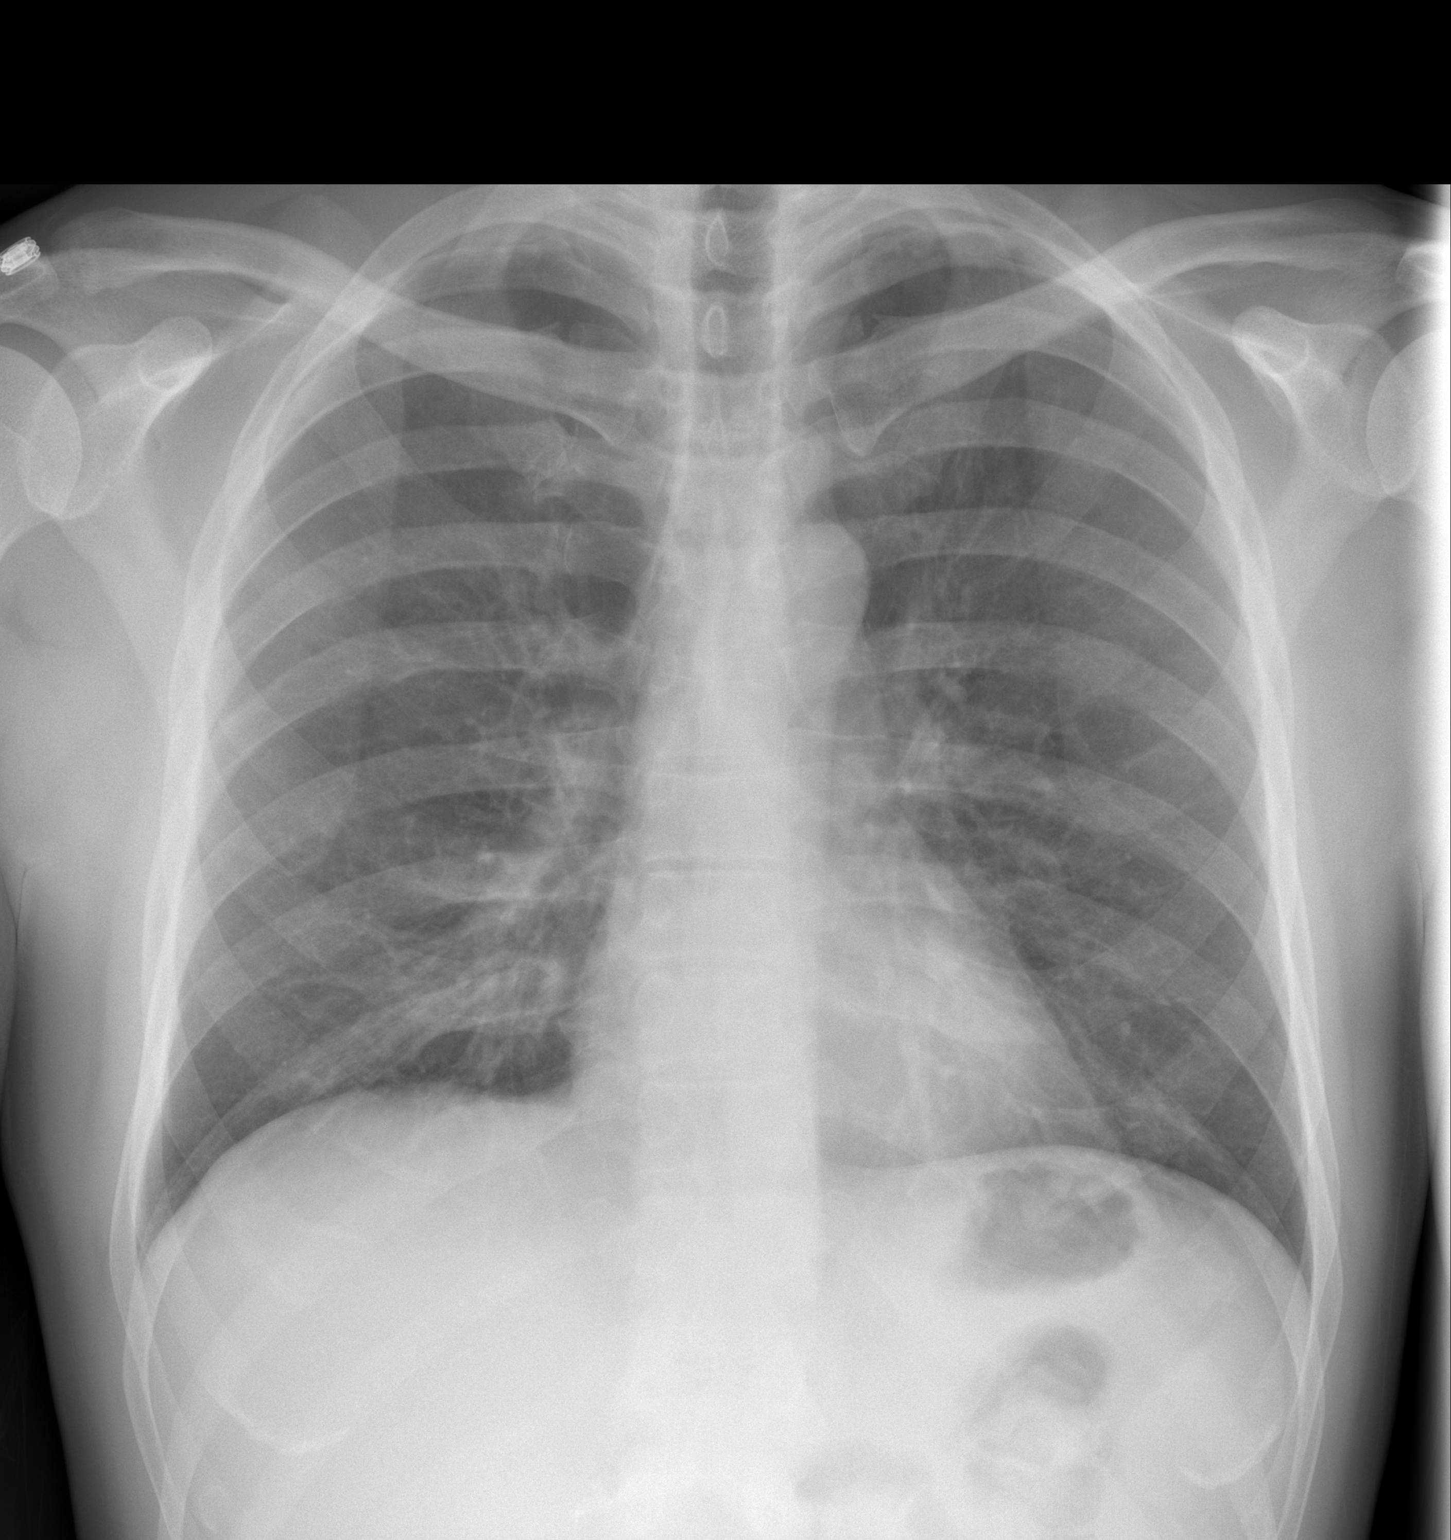

[w chest lat]
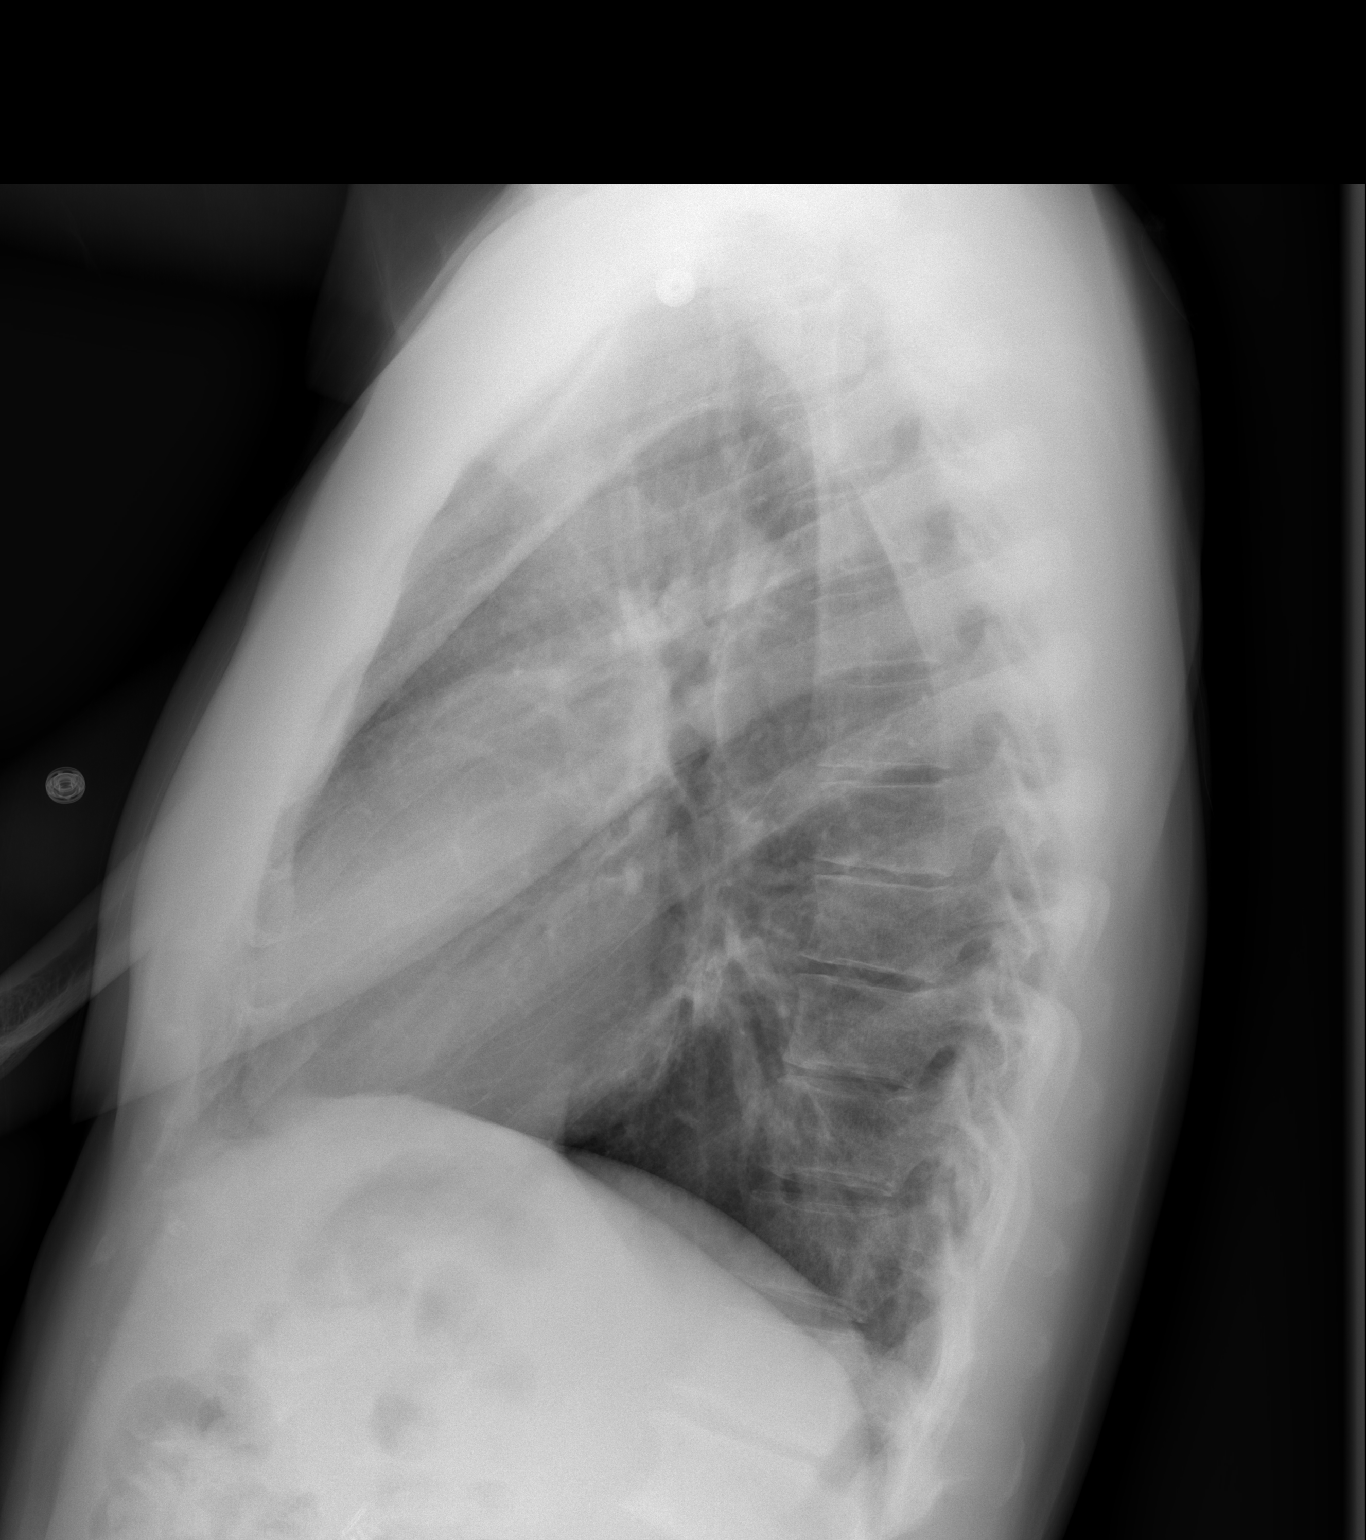

[2 of 2 positions shown; findings below may reference images not displayed]

FINDINGS: The heart size and mediastinal contours are within
normal limits.  Both lungs are clear.  The visualized skeletal
structures are unremarkable.
IMPRESSION: No active cardiopulmonary disease.

## 2014-12-01 DIAGNOSIS — R072 Precordial pain: Secondary | ICD-10-CM | POA: Insufficient documentation

## 2014-12-01 DIAGNOSIS — F3170 Bipolar disorder, currently in remission, most recent episode unspecified: Secondary | ICD-10-CM | POA: Insufficient documentation

## 2018-01-06 DIAGNOSIS — M5136 Other intervertebral disc degeneration, lumbar region: Secondary | ICD-10-CM | POA: Insufficient documentation

## 2018-02-04 ENCOUNTER — Other Ambulatory Visit: Payer: Self-pay | Admitting: Orthopedic Surgery

## 2018-02-04 DIAGNOSIS — M5136 Other intervertebral disc degeneration, lumbar region: Secondary | ICD-10-CM

## 2018-02-10 ENCOUNTER — Inpatient Hospital Stay
Admission: RE | Admit: 2018-02-10 | Discharge: 2018-02-10 | Disposition: A | Discharge status: Home / Self Care | Payer: Self-pay | Source: Ambulatory Visit | Attending: Orthopedic Surgery | Admitting: Orthopedic Surgery

## 2018-02-19 ENCOUNTER — Other Ambulatory Visit: Payer: Self-pay

## 2018-02-19 ENCOUNTER — Inpatient Hospital Stay
Admission: RE | Admit: 2018-02-19 | Discharge: 2018-02-19 | Disposition: A | Discharge status: Home / Self Care | Payer: Self-pay | Source: Ambulatory Visit | Attending: Orthopedic Surgery | Admitting: Orthopedic Surgery

## 2018-02-19 NOTE — Discharge Instructions (Signed)
Discogram Post Procedure Discharge Instructions ° °1. May resume a regular diet and any medications that you routinely take (including pain medications). °2. No driving day of procedure. °3. Upon discharge go home and rest for at least 4 hours.  May use an ice pack as needed to injection sites on back.  Ice to back 30 minutes on and 30 minutes off, all day. °4. May remove bandades later, today. °5. It is not unusual to be sore for several days after this procedure. ° ° ° °Please contact our office at [phone] for the following symptoms: ° °· Fever greater than 100 degrees °· Increased swelling, pain, or redness at injection site. ° ° °Thank you for visiting [facility]. ° ° °

## 2018-12-22 DIAGNOSIS — K2100 Gastro-esophageal reflux disease with esophagitis, without bleeding: Secondary | ICD-10-CM | POA: Insufficient documentation

## 2018-12-22 DIAGNOSIS — Z8669 Personal history of other diseases of the nervous system and sense organs: Secondary | ICD-10-CM | POA: Insufficient documentation

## 2018-12-22 DIAGNOSIS — Z8711 Personal history of peptic ulcer disease: Secondary | ICD-10-CM | POA: Insufficient documentation

## 2019-10-25 DIAGNOSIS — R0600 Dyspnea, unspecified: Secondary | ICD-10-CM

## 2021-09-24 ENCOUNTER — Encounter: Payer: Self-pay | Admitting: Cardiology

## 2021-10-11 ENCOUNTER — Encounter: Payer: Self-pay | Admitting: *Deleted

## 2021-10-11 ENCOUNTER — Encounter: Payer: Self-pay | Admitting: Cardiology

## 2021-10-26 ENCOUNTER — Ambulatory Visit: Payer: Managed Care, Other (non HMO) | Admitting: Cardiology

## 2021-10-26 ENCOUNTER — Encounter: Payer: Self-pay | Admitting: Cardiology

## 2021-10-26 VITALS — BP 128/70 | HR 57 | Ht 74.0 in | Wt 166.4 lb

## 2021-10-26 DIAGNOSIS — E785 Hyperlipidemia, unspecified: Secondary | ICD-10-CM | POA: Insufficient documentation

## 2021-10-26 DIAGNOSIS — J438 Other emphysema: Secondary | ICD-10-CM

## 2021-10-26 DIAGNOSIS — N529 Male erectile dysfunction, unspecified: Secondary | ICD-10-CM | POA: Diagnosis not present

## 2021-10-26 DIAGNOSIS — F3170 Bipolar disorder, currently in remission, most recent episode unspecified: Secondary | ICD-10-CM | POA: Diagnosis not present

## 2021-10-26 DIAGNOSIS — R072 Precordial pain: Secondary | ICD-10-CM | POA: Diagnosis not present

## 2021-10-26 MED ORDER — ROSUVASTATIN CALCIUM 10 MG PO TABS: 10.0000 mg | ORAL_TABLET | Freq: Every day | ORAL | 0 refills | Status: DC

## 2021-10-26 MED ORDER — NITROGLYCERIN 0.4 MG SL SUBL: 0.4000 mg | SUBLINGUAL_TABLET | SUBLINGUAL | 6 refills | Status: DC | PRN

## 2021-10-26 MED ORDER — ASPIRIN EC 81 MG PO TBEC: 81.0000 mg | DELAYED_RELEASE_TABLET | Freq: Every day | ORAL | 3 refills | Status: DC

## 2021-10-26 MED ORDER — METOPROLOL TARTRATE 25 MG PO TABS: 25.0000 mg | ORAL_TABLET | Freq: Two times a day (BID) | ORAL | 0 refills | Status: DC

## 2021-10-26 NOTE — H&P (View-Only) (Signed)
? ?Cardiology Consultation:   ? ?Date:  10/26/2021  ? ?ID:  Richard Sanford, DOB 05/16/1966, MRN [id number] ? ?PCP:  Myrlene Broker, MD  ?Cardiologist:  Jenne Campus, MD  ? ?Referring MD: Donia Ast, [place]  ? ?Chief Complaint  ?Patient presents with  ? Abnormal stress test   ? Chest Pain  ? ? ?History of Present Illness:   ? ?Richard Sanford is a 56 y.o. male who is being seen today for the evaluation of chest pain at the request of Donia Ast, [place].  He is a chronic smoker to be smoking all his life however now she is trying to quit, also have bipolar disorder which is stable on lithium, also dyslipidemia with low HDL.  He was referred to [place] because of chest pain.  He described to have tightness in the chest and happen when he tried to do something that been going on for about 3 months became a little more frequent and lasting a bit longer lately.  That sensation is typically associated with shortness of breath as well with some sweating.  That forced him to slow down overall.  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  He was scheduled to have a stress test, stress test that was done in [facility] stress to show some possibility of old inferior septal MI without evidence of ischemia. ?He does not exercise on the regular basis, he is not on any special diet he does have family history of premature coronary artery disease apparently his grandfather had first heart attack at his age. ? ?Past Medical History:  ?Diagnosis Date  ? Angina   ? Asthma   ? Bipolar 1 disorder ([place])   ? CAD (coronary artery disease)   ? CHRONIC AIRWAY OBSTRUCTION NEC 06/16/2008  ? Qualifier: Diagnosis of  By: Tilden Dome    ? COPD (chronic obstructive pulmonary disease) ([place])   ? Depression   ? EMPHYSEMA 07/04/2010  ? Qualifier: Diagnosis of  By: Charma Igo    ? Esophageal reflux 06/16/2008  ? Qualifier: Diagnosis of  By: Tilden Dome    ? GERD  (gastroesophageal reflux disease)   ? H/O: gout   ? HEMOPTYSIS UNSPECIFIED 07/04/2010  ? Qualifier: Diagnosis of  By: Gwenette Greet MD, Armando Reichert   ? Hypercholesterolemia   ? 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 Qualifier: Diagnosis of  By: Tilden Dome    ? Mental disorder   ? bipolar  ? Rotator cuff tear, left   ? ? ?Past Surgical History:  ?Procedure Laterality Date  ? CHOLECYSTECTOMY    ? SHOULDER OPEN ROTATOR CUFF REPAIR  09/26/2011  ? Procedure: ROTATOR CUFF REPAIR SHOULDER OPEN;  Surgeon: Magnus Sinning, MD;  Location: [facility];  Service: Orthopedics;  Laterality: Left;  Open Left Shoulder Anterior Acrominectomy and Rotator Cuff Repair  ? TOOTH EXTRACTION    ? VASECTOMY    ? ? ?Current Medications: ?Current Meds  ?Medication Sig  ? gabapentin (NEURONTIN) 300 MG capsule Take 600 mg by mouth 2 (two) times daily.  ? lamoTRIgine (LAMICTAL) 200 MG tablet Take 200 mg by mouth 2 (two) times daily.  ? lithium carbonate 300 MG capsule Take 600 mg by mouth 2 (two) times daily with a meal.  ? pantoprazole (PROTONIX) 40 MG tablet Take 40 mg by mouth daily.  ? tadalafil (CIALIS) 20 MG tablet Take 20 mg by mouth  daily as needed for erectile dysfunction.  ?  ? ?Allergies:   Penicillins, Nsaids, and Tolmetin  ? ?Social History  ? ?Socioeconomic History  ? Marital status: Single  ?  Spouse name: Not on file  ? Number of children: Not on file  ? Years of education: Not on file  ? Highest education level: Not on file  ?Occupational History  ? Not on file  ?Tobacco Use  ? Smoking status: Every Day  ?  Packs/day: 1.00  ?  Types: Cigarettes  ? Smokeless tobacco: Not on file  ?Substance and Sexual Activity  ? Alcohol use: No  ? Drug use: No  ? Sexual activity: Not on file  ?Other Topics Concern  ? Not on file  ?Social History Narrative  ? Not on file  ? ?Social Determinants of Health  ? ?Financial Resource Strain: Not on file  ?Food Insecurity: Not on file  ?Transportation Needs: Not on file  ?Physical Activity: Not on file  ?Stress:  Not on file  ?Social Connections: Not on file  ?  ? ?Family History: ?The patient's family history includes Asthma in his mother; Heart disease in his maternal grandfather. ?ROS:   ?Please see the history of present illness.    ?All 14 point review of systems negative except as described per history of present illness. ? ?EKGs/Labs/Other Studies Reviewed:   ? ?The following studies were reviewed today: ?Stress test reviewed from the hospital report says fixed defect inferior septal wall no ischemia. ? ?EKG:  EKG is  ordered today.  The ekg ordered today demonstrates normal sinus rhythm normal P interval normal QS complex duration fulgent no ST segment changes ? ?Recent Labs: ?No results found for requested labs within last 8760 hours.  ?Recent Lipid Panel ?No results found for: CHOL, TRIG, HDL, CHOLHDL, VLDL, LDLCALC, LDLDIRECT ? ?Physical Exam:   ? ?VS:  BP 128/70 (BP Location: Left Arm, Patient Position: Sitting)   Pulse (!) 57   Ht 6\' 2"  (1.88 m)   Wt 166 lb 6.4 oz (75.5 kg)   SpO2 98%   BMI 21.36 kg/m?    ? ?Wt Readings from Last 3 Encounters:  ?10/26/21 166 lb 6.4 oz (75.5 kg)  ?09/28/21 167 lb (75.8 kg)  ?09/26/11 176 lb 8 oz (80.1 kg)  ?  ? ?GEN:  Well nourished, well developed in no acute distress ?HEENT: Normal ?NECK: No JVD; No carotid bruits ?LYMPHATICS: No lymphadenopathy ?CARDIAC: RRR, no murmurs, no rubs, no gallops ?RESPIRATORY:  Clear to auscultation without rales, wheezing or rhonchi  ?ABDOMEN: Soft, non-tender, non-distended ?MUSCULOSKELETAL:  No edema; No deformity  ?SKIN: Warm and dry ?NEUROLOGIC:  Alert and oriented x 3 ?PSYCHIATRIC:  Normal affect  ? ?ASSESSMENT:   ? ?1. Erectile dysfunction, unspecified erectile dysfunction type   ?2. Dyslipidemia   ?3. Precordial pain   ?4. Bipolar affective disorder in remission [facility])   ?5. EMPHYSEMA   ? ?PLAN:   ? ?In order of problems listed above: ? ?Evaluated suspicious story for typical angina pectoris.  He described a situation that this pain gets  more frequent and lasting longer even a few episodes that happen at rest did not have any recent resting chest pain.  He does have numerous risk factors for coronary artery disease which include low HDL, smoking which is still ongoing, family history of premature coronary artery disease.  Stress test done in the hospital surprising show evidence of old myocardial infarction however EKG did not confirm that.  I think  we have enough for some symptoms and enough risk factors to proceed directly to cardiac catheterization to clarify make sure he does not have significant obstructive disease.  He tells me that he had cardiac catheterization done 10 to 15 years ago and apparently everything was okay at that time.  In the meantime I asked him to start taking 1 baby aspirin every single day, will give him prescription for metoprolol twice daily, will give him also nitroglycerin.  When asking not to take Cialis until we have situation clear.  I will also initiate therapy with statin.  We will put him on Crestor 10 mg daily.  I described cardiac catheterization to him including all risk benefits as well as alternatives.  We will schedule him to have cardiac catheterization done ?2. Dyslipidemia we will start him on Crestor 10 ?3.  Bipolar disorder that being stable ?4.  Smoking obviously we had discussion about this I strongly recommended to quit ? ? ?Medication Adjustments/Labs and Tests Ordered: ?Current medicines are reviewed at length with the patient today.  Concerns regarding medicines are outlined above.  ?No orders of the defined types were placed in this encounter. ? ?No orders of the defined types were placed in this encounter. ? ? ?Signed, ?Park Liter, MD, [facility]. ?10/26/2021 3:35 PM    ?[place] ? ?

## 2021-10-26 NOTE — Progress Notes (Addendum)
? ?Cardiology Consultation:   ? ?Date:  10/26/2021  ? ?ID:  Richard Sanford, DOB 1965/11/06, MRN [id number] ? ?PCP:  Myrlene Broker, MD  ?Cardiologist:  Jenne Campus, MD  ? ?Referring MD: Donia Ast, [place]  ? ?Chief Complaint  ?Patient presents with  ? Abnormal stress test   ? Chest Pain  ? ? ?History of Present Illness:   ? ?Richard Sanford is a 56 y.o. male who is being seen today for the evaluation of chest pain at the request of Donia Ast, [place].  He is a chronic smoker to be smoking all his life however now she is trying to quit, also have bipolar disorder which is stable on lithium, also dyslipidemia with low HDL.  He was referred to [place] because of chest pain.  He described to have tightness in the chest and happen when he tried to do something that been going on for about 3 months became a little more frequent and lasting a bit longer lately.  That sensation is typically associated with shortness of breath as well with some sweating.  That forced him to slow down overall.  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  He was scheduled to have a stress test, stress test that was done in [facility] stress to show some possibility of old inferior septal MI without evidence of ischemia. ?He does not exercise on the regular basis, he is not on any special diet he does have family history of premature coronary artery disease apparently his grandfather had first heart attack at his age. ? ?Past Medical History:  ?Diagnosis Date  ? Angina   ? Asthma   ? Bipolar 1 disorder ([place])   ? CAD (coronary artery disease)   ? CHRONIC AIRWAY OBSTRUCTION NEC 06/16/2008  ? Qualifier: Diagnosis of  By: Tilden Dome    ? COPD (chronic obstructive pulmonary disease) ([place])   ? Depression   ? EMPHYSEMA 07/04/2010  ? Qualifier: Diagnosis of  By: Charma Igo    ? Esophageal reflux 06/16/2008  ? Qualifier: Diagnosis of  By: Tilden Dome    ? GERD  (gastroesophageal reflux disease)   ? H/O: gout   ? HEMOPTYSIS UNSPECIFIED 07/04/2010  ? Qualifier: Diagnosis of  By: Gwenette Greet MD, Armando Reichert   ? Hypercholesterolemia   ? 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 Qualifier: Diagnosis of  By: Tilden Dome    ? Mental disorder   ? bipolar  ? Rotator cuff tear, left   ? ? ?Past Surgical History:  ?Procedure Laterality Date  ? CHOLECYSTECTOMY    ? SHOULDER OPEN ROTATOR CUFF REPAIR  09/26/2011  ? Procedure: ROTATOR CUFF REPAIR SHOULDER OPEN;  Surgeon: Magnus Sinning, MD;  Location: [facility];  Service: Orthopedics;  Laterality: Left;  Open Left Shoulder Anterior Acrominectomy and Rotator Cuff Repair  ? TOOTH EXTRACTION    ? VASECTOMY    ? ? ?Current Medications: ?Current Meds  ?Medication Sig  ? gabapentin (NEURONTIN) 300 MG capsule Take 600 mg by mouth 2 (two) times daily.  ? lamoTRIgine (LAMICTAL) 200 MG tablet Take 200 mg by mouth 2 (two) times daily.  ? lithium carbonate 300 MG capsule Take 600 mg by mouth 2 (two) times daily with a meal.  ? pantoprazole (PROTONIX) 40 MG tablet Take 40 mg by mouth daily.  ? tadalafil (CIALIS) 20 MG tablet Take 20 mg by mouth  daily as needed for erectile dysfunction.  ?  ? ?Allergies:   Penicillins, Nsaids, and Tolmetin  ? ?Social History  ? ?Socioeconomic History  ? Marital status: Single  ?  Spouse name: Not on file  ? Number of children: Not on file  ? Years of education: Not on file  ? Highest education level: Not on file  ?Occupational History  ? Not on file  ?Tobacco Use  ? Smoking status: Every Day  ?  Packs/day: 1.00  ?  Types: Cigarettes  ? Smokeless tobacco: Not on file  ?Substance and Sexual Activity  ? Alcohol use: No  ? Drug use: No  ? Sexual activity: Not on file  ?Other Topics Concern  ? Not on file  ?Social History Narrative  ? Not on file  ? ?Social Determinants of Health  ? ?Financial Resource Strain: Not on file  ?Food Insecurity: Not on file  ?Transportation Needs: Not on file  ?Physical Activity: Not on file  ?Stress:  Not on file  ?Social Connections: Not on file  ?  ? ?Family History: ?The patient's family history includes Asthma in his mother; Heart disease in his maternal grandfather. ?ROS:   ?Please see the history of present illness.    ?All 14 point review of systems negative except as described per history of present illness. ? ?EKGs/Labs/Other Studies Reviewed:   ? ?The following studies were reviewed today: ?Stress test reviewed from the hospital report says fixed defect inferior septal wall no ischemia. ? ?EKG:  EKG is  ordered today.  The ekg ordered today demonstrates normal sinus rhythm normal P interval normal QS complex duration fulgent no ST segment changes ? ?Recent Labs: ?No results found for requested labs within last 8760 hours.  ?Recent Lipid Panel ?No results found for: CHOL, TRIG, HDL, CHOLHDL, VLDL, LDLCALC, LDLDIRECT ? ?Physical Exam:   ? ?VS:  BP 128/70 (BP Location: Left Arm, Patient Position: Sitting)   Pulse (!) 57   Ht 6\' 2"  (1.88 m)   Wt 166 lb 6.4 oz (75.5 kg)   SpO2 98%   BMI 21.36 kg/m?    ? ?Wt Readings from Last 3 Encounters:  ?10/26/21 166 lb 6.4 oz (75.5 kg)  ?09/28/21 167 lb (75.8 kg)  ?09/26/11 176 lb 8 oz (80.1 kg)  ?  ? ?GEN:  Well nourished, well developed in no acute distress ?HEENT: Normal ?NECK: No JVD; No carotid bruits ?LYMPHATICS: No lymphadenopathy ?CARDIAC: RRR, no murmurs, no rubs, no gallops ?RESPIRATORY:  Clear to auscultation without rales, wheezing or rhonchi  ?ABDOMEN: Soft, non-tender, non-distended ?MUSCULOSKELETAL:  No edema; No deformity  ?SKIN: Warm and dry ?NEUROLOGIC:  Alert and oriented x 3 ?PSYCHIATRIC:  Normal affect  ? ?ASSESSMENT:   ? ?1. Erectile dysfunction, unspecified erectile dysfunction type   ?2. Dyslipidemia   ?3. Precordial pain   ?4. Bipolar affective disorder in remission [facility])   ?5. EMPHYSEMA   ? ?PLAN:   ? ?In order of problems listed above: ? ?Suspicious story for typical angina pectoris.  He described a situation that this pain gets more  frequent and lasting longer even a few episodes that happen at rest did not have any recent resting chest pain.  He does have numerous risk factors for coronary artery disease which include low HDL, smoking which is still ongoing, family history of premature coronary artery disease.  Stress test done in the hospital surprising show evidence of old myocardial infarction however EKG did not confirm that.  I think we  have enough for some symptoms and enough risk factors to proceed directly to cardiac catheterization to clarify make sure he does not have significant obstructive disease.  He tells me that he had cardiac catheterization done 10 to 15 years ago and apparently everything was okay at that time.  In the meantime I asked him to start taking 1 baby aspirin every single day, will give him prescription for metoprolol twice daily, will give him also nitroglycerin.  When asking not to take Cialis until we have situation clear.  I will also initiate therapy with statin.  We will put him on Crestor 10 mg daily.  I described cardiac catheterization to him including all risk benefits as well as alternatives.  We will schedule him to have cardiac catheterization done ?2. Dyslipidemia we will start him on Crestor 10 ?3.  Bipolar disorder that being stable ?4.  Smoking obviously we had discussion about this I strongly recommended to quit ? ? ?Medication Adjustments/Labs and Tests Ordered: ?Current medicines are reviewed at length with the patient today.  Concerns regarding medicines are outlined above.  ?No orders of the defined types were placed in this encounter. ? ?No orders of the defined types were placed in this encounter. ? ? ?Signed, ?Park Liter, MD, [facility]. ?10/26/2021 3:35 PM    ?[place] ? ?

## 2021-10-26 NOTE — Patient Instructions (Signed)
Medication Instructions:  ?START:  ?Enteric Coated Baby Aspirin 81mg  1 tablet every day by mouth ? ?Metoprolol 25mg  1 tablet twice daily by mouth ? ?Nitrogylcerin ?Use nitroglycerin 1 tablet placed under the tongue at the first sign of chest pain or an angina attack. 1 tablet may be used every 5 minutes as needed, for up to 15 minutes. Do not take more than 3 tablets in 15 minutes. If pain persist call 911 or go to the nearest ED.  ? ?Crestor 10mg  1 tablet by mouth every day ? ? ?STOP: Cialis ? ? ?  ? ?*If you need a refill on your cardiac medications before your next appointment, please call your pharmacy* ? ? ?Lab Work: ?BMP And CBC Prior to Procedure. ? ?If you have labs (blood work) drawn today and your tests are completely normal, you will receive your results only by: ?MyChart Message (if you have MyChart) OR ?A paper copy in the mail ?If you have any lab test that is abnormal or we need to change your treatment, we will call you to review the results. ? ? ?Testing/Procedures: ? ?[facility] ?[facility] [place] ?[address] ?[place] [id number] ?Dept: [phone] ?Loc: [phone]: [phone] ? ?Richard PotashWilliam T Sanford  10/26/2021 ? ?You are scheduled for a Cardiac Catheterization on Thursday, May 11 with Dr. Cristal Deerhristopher End. ? ?1. Please arrive at the Main Entrance A at [facility]: [address] [place], [place] [address] at 9:30 AM (This time is two hours before your procedure to ensure your preparation). Free valet parking service is available.  ? ?Special note: Every effort is made to have your procedure done on time. Please understand that emergencies sometimes delay scheduled procedures. ? ?2. Diet: Do not eat solid foods after midnight.  You may have clear liquids until 5 AM upon the day of the procedure. ? ?3. Labs: You will need to have blood drawn on Monday, May 8 at [facility]: [address], [place] . You do not need to be fasting. ? ?4.  Medication instructions in preparation for your procedure: ? ? Contrast Allergy: No ? ? ?On the morning of your procedure, take Aspirin and any morning medicines NOT listed above.  You may use sips of water. ? ?5. Plan to go home the same day, you will only stay overnight if medically necessary. ?6. You MUST have a responsible adult to drive you home. ?7. An adult MUST be with you the first 24 hours after you arrive home. ?8. Bring a current list of your medications, and the last time and date medication taken. ?9. Bring ID and current insurance cards. ?10.Please wear clothes that are easy to get on and off and wear slip-on shoes. ? ?Thank you for allowing us to care for you! ?  -- [facility]  ? ? ?Follow-Up: ?At [facility], you and your health needs are our priority.  As part of our continuing mission to provide you with exceptional heart care, we have created designated Provider Care Teams.  These Care Teams include your primary Cardiologist (physician) and Advanced Practice Providers (APPs -  Physician Assistants and Nurse Practitioners) who all work together to provide you with the care you need, when you need it. ? ?We recommend signing up for the patient portal called "MyChart".  Sign up information is provided on this After Visit Summary.  MyChart is used to connect with patients for Virtual Visits (Telemedicine).  Patients are able to view lab/test results, encounter notes, upcoming appointments, etc.  Non-urgent messages can be sent to your provider as well.   ?To learn more about what you can do with MyChart, go to [email or web address].   ? ?Your next appointment:   ?6 week(s) ? ?The format for your next appointment:   ?In Person ? ?Provider:   ?Gypsy Balsam, MD ? ? ?Other Instructions ? ?Coronary Angiogram With Stent ?Coronary angiogram with stent placement is a procedure to widen or open a narrow blood vessel of the heart (coronary artery). Arteries may become  blocked by cholesterol buildup (plaques) in the lining of the artery wall. When a coronary artery becomes partially blocked, blood flow to that area decreases. This may lead to chest pain or a heart attack (myocardial infarction). ?A stent is a small piece of metal that looks like mesh or spring. Stent placement may be done as treatment after a heart attack, or to prevent a heart attack if a blocked artery is found by a coronary angiogram. ?Let your health care provider know about: ?Any allergies you have, including allergies to medicines or contrast dye. ?All medicines you are taking, including vitamins, herbs, eye drops, creams, and over-the-counter medicines. ?Any problems you or family members have had with anesthetic medicines. ?Any blood disorders you have. ?Any surgeries you have had. ?Any medical conditions you have, including kidney problems or kidney failure. ?Whether you are pregnant or may be pregnant. ?Whether you are breastfeeding. ?What are the risks? ?Generally, this is a safe procedure. However, serious problems may occur, including: ?Damage to nearby structures or organs, such as the heart, blood vessels, or kidneys. ?A return of blockage. ?Bleeding, infection, or bruising at the insertion site. ?A collection of blood under the skin (hematoma) at the insertion site. ?A blood clot in another part of the body. ?Allergic reaction to medicines or dyes. ?Bleeding into the abdomen (retroperitoneal bleeding). ?Stroke (rare). ?Heart attack (rare). ?What happens before the procedure? ?Staying hydrated ?Follow instructions from your health care provider about hydration, which may include: ?Up to 2 hours before the procedure - you may continue to drink clear liquids, such as water, clear fruit juice, black coffee, and plain tea.  ?  ?Eating and drinking restrictions ?Follow instructions from your health care provider about eating and drinking, which may include: ?8 hours before the procedure - stop eating  heavy meals or foods, such as meat, fried foods, or fatty foods. ?6 hours before the procedure - stop eating light meals or foods, such as toast or cereal. ?2 hours before the procedure - stop drinking clear liquids. ?Medicines ?Ask your health care provider about: ?Changing or stopping your regular medicines. This is especially important if you are taking diabetes medicines or blood thinners. ?Taking medicines such as aspirin and ibuprofen. These medicines can thin your blood. Do not take these medicines unless your health care provider tells you to take them. ?Generally, aspirin is recommended before a thin tube, called a catheter, is passed through a blood vessel and inserted into the heart (cardiac catheterization). ?Taking over-the-counter medicines, vitamins, herbs, and supplements. ?General instructions ?Do not use any products that contain nicotine or tobacco for at least 4 weeks before the procedure. These products include cigarettes, e-cigarettes, and chewing tobacco. If you need help quitting, ask your health care provider. ?Plan to have someone take you home from the hospital or clinic. ?If you will be going home right after the procedure, plan to have someone with you for  24 hours. ?You may have tests and imaging procedures. ?Ask your health care provider: ?How your insertion site will be marked. Ask which artery will be used for the procedure. ?What steps will be taken to help prevent infection. These may include: ?Removing hair at the insertion site. ?Washing skin with a germ-killing soap. ?Taking antibiotic medicine. ?What happens during the procedure? ?An IV will be inserted into one of your veins. ?Electrodes may be placed on your chest to monitor your heart rate during the procedure. ?You will be given one or more of the following: ?A medicine to help you relax (sedative). ?A medicine to numb the area (local anesthetic) for catheter insertion. ?A small incision will be made for catheter  insertion. ?The catheter will be inserted into an artery using a guide wire. The location may be in your groin, your wrist, or the fold of your arm (near your elbow). ?An X-ray procedure (fluoroscopy) will be use

## 2021-10-26 NOTE — Addendum Note (Signed)
Addended by: Baldo Ash D on: 10/26/2021 04:05 PM ? ? Modules accepted: Orders ? ?

## 2021-10-31 ENCOUNTER — Other Ambulatory Visit: Payer: Self-pay | Admitting: *Deleted

## 2021-10-31 ENCOUNTER — Telehealth: Payer: Self-pay | Admitting: *Deleted

## 2021-10-31 DIAGNOSIS — R072 Precordial pain: Secondary | ICD-10-CM

## 2021-10-31 DIAGNOSIS — Z01812 Encounter for preprocedural laboratory examination: Secondary | ICD-10-CM

## 2021-10-31 LAB — CBC
Hematocrit: 42.7 % (ref 37.5–51.0)
Hemoglobin: 14.3 g/dL (ref 13.0–17.7)
MCH: 31.8 pg (ref 26.6–33.0)
MCHC: 33.5 g/dL (ref 31.5–35.7)
MCV: 95 fL (ref 79–97)
Platelets: 289 10*3/uL (ref 150–450)
RBC: 4.5 x10E6/uL (ref 4.14–5.80)
RDW: 11.8 % (ref 11.6–15.4)
WBC: 8.4 10*3/uL (ref 3.4–10.8)

## 2021-10-31 LAB — BASIC METABOLIC PANEL
BUN/Creatinine Ratio: 6 — ABNORMAL LOW (ref 9–20)
BUN: 6 mg/dL (ref 6–24)
CO2: 22 mmol/L (ref 20–29)
Calcium: 9.5 mg/dL (ref 8.7–10.2)
Chloride: 104 mmol/L (ref 96–106)
Creatinine, Ser: 1.03 mg/dL (ref 0.76–1.27)
Glucose: 83 mg/dL (ref 70–99)
Potassium: 4.3 mmol/L (ref 3.5–5.2)
Sodium: 140 mmol/L (ref 134–144)
eGFR: 85 mL/min/{1.73_m2} (ref >59)

## 2021-10-31 NOTE — Telephone Encounter (Addendum)
Cardiac Catheterization scheduled at [facility] for: Thursday Nov 01, 2021 11:30 AM ?Arrival time and place: [facility] Main Entrance A at: 9:30 AM ? ? ?Nothing to eat after midnight prior to procedure, clear liquids until 5 AM day of procedure. ? ?Medication instructions: ?-Usual morning medications can be taken with sips of water including aspirin 81 mg. ? ?Confirmed patient has responsible adult to drive home post procedure and be with patient first 24 hours after arriving home. ? ?Patient reports no new symptoms concerning for COVID-19/no exposure to COVID-19 in the past 10 days. ? ?I spoke with patient's wife (DPR), she states patient did not get lab 10/29/21 as instructed, does plan on proceeding with procedure tomorrow. ? ?Patient's wife will text patient and ask him to call me to review procedure instructions, discuss lab.  ?

## 2021-10-31 NOTE — Telephone Encounter (Signed)
I spoke with patient, reviewed instructions, patient will go by [facility] [employer] this afternoon for STAT BMP/CBC ([facility] aware to run STAT).  ?

## 2021-11-01 ENCOUNTER — Other Ambulatory Visit: Payer: Self-pay | Admitting: Internal Medicine

## 2021-11-01 ENCOUNTER — Other Ambulatory Visit (HOSPITAL_COMMUNITY): Payer: Self-pay

## 2021-11-01 ENCOUNTER — Encounter (HOSPITAL_COMMUNITY)
Admission: RE | Disposition: A | Discharge status: Home / Self Care | Payer: Self-pay | Source: Home / Self Care | Attending: Internal Medicine

## 2021-11-01 ENCOUNTER — Other Ambulatory Visit: Payer: Self-pay

## 2021-11-01 ENCOUNTER — Ambulatory Visit (HOSPITAL_COMMUNITY)
Admission: RE | Admit: 2021-11-01 | Discharge: 2021-11-01 | Disposition: A | Discharge status: Home / Self Care | Payer: Managed Care, Other (non HMO) | Attending: Internal Medicine | Admitting: Internal Medicine

## 2021-11-01 DIAGNOSIS — N529 Male erectile dysfunction, unspecified: Secondary | ICD-10-CM | POA: Insufficient documentation

## 2021-11-01 DIAGNOSIS — J439 Emphysema, unspecified: Secondary | ICD-10-CM | POA: Diagnosis not present

## 2021-11-01 DIAGNOSIS — Z955 Presence of coronary angioplasty implant and graft: Secondary | ICD-10-CM | POA: Diagnosis not present

## 2021-11-01 DIAGNOSIS — E785 Hyperlipidemia, unspecified: Secondary | ICD-10-CM | POA: Diagnosis not present

## 2021-11-01 DIAGNOSIS — I25110 Atherosclerotic heart disease of native coronary artery with unstable angina pectoris: Secondary | ICD-10-CM | POA: Insufficient documentation

## 2021-11-01 DIAGNOSIS — Z79899 Other long term (current) drug therapy: Secondary | ICD-10-CM | POA: Diagnosis not present

## 2021-11-01 DIAGNOSIS — F319 Bipolar disorder, unspecified: Secondary | ICD-10-CM | POA: Diagnosis not present

## 2021-11-01 DIAGNOSIS — Z8249 Family history of ischemic heart disease and other diseases of the circulatory system: Secondary | ICD-10-CM | POA: Diagnosis not present

## 2021-11-01 DIAGNOSIS — F17210 Nicotine dependence, cigarettes, uncomplicated: Secondary | ICD-10-CM | POA: Diagnosis not present

## 2021-11-01 DIAGNOSIS — I200 Unstable angina: Secondary | ICD-10-CM

## 2021-11-01 DIAGNOSIS — I209 Angina pectoris, unspecified: Secondary | ICD-10-CM

## 2021-11-01 HISTORY — PX: INTRAVASCULAR PRESSURE WIRE/FFR STUDY: CATH118243

## 2021-11-01 HISTORY — PX: CORONARY STENT INTERVENTION: CATH118234

## 2021-11-01 HISTORY — PX: INTRAVASCULAR IMAGING/OCT: CATH118326

## 2021-11-01 HISTORY — PX: LEFT HEART CATH AND CORONARY ANGIOGRAPHY: CATH118249

## 2021-11-01 LAB — POCT ACTIVATED CLOTTING TIME
Activated Clotting Time: 245 seconds
Activated Clotting Time: 263 seconds
Activated Clotting Time: 287 seconds

## 2021-11-01 SURGERY — LEFT HEART CATH AND CORONARY ANGIOGRAPHY
Anesthesia: LOCAL

## 2021-11-01 MED ORDER — ONDANSETRON HCL 4 MG/2ML IJ SOLN: 4.0000 mg | Freq: Four times a day (QID) | INTRAMUSCULAR | Status: DC | PRN

## 2021-11-01 MED ORDER — SODIUM CHLORIDE 0.9% FLUSH: 3.0000 mL | Freq: Two times a day (BID) | INTRAVENOUS | Status: DC

## 2021-11-01 MED ORDER — SODIUM CHLORIDE 0.9 % IV SOLN: 250.0000 mL | INTRAVENOUS | Status: DC | PRN

## 2021-11-01 MED ORDER — SODIUM CHLORIDE 0.9 % WEIGHT BASED INFUSION: 1.0000 mL/kg/h | INTRAVENOUS | Status: DC

## 2021-11-01 MED ORDER — SODIUM CHLORIDE 0.9% FLUSH: 3.0000 mL | INTRAVENOUS | Status: DC | PRN

## 2021-11-01 MED ORDER — LIDOCAINE HCL (PF) 1 % IJ SOLN
INTRAMUSCULAR | Status: DC | PRN
  Administered 2021-11-01: 2 mL

## 2021-11-01 MED ORDER — CLOPIDOGREL BISULFATE 300 MG PO TABS
ORAL_TABLET | ORAL | Status: AC
  Filled 2021-11-01: qty 2

## 2021-11-01 MED ORDER — CLOPIDOGREL BISULFATE 300 MG PO TABS
ORAL_TABLET | ORAL | Status: DC | PRN
  Administered 2021-11-01: 600 mg via ORAL

## 2021-11-01 MED ORDER — NITROGLYCERIN 1 MG/10 ML FOR IR/CATH LAB
INTRA_ARTERIAL | Status: AC
  Filled 2021-11-01: qty 10

## 2021-11-01 MED ORDER — FENTANYL CITRATE (PF) 100 MCG/2ML IJ SOLN
INTRAMUSCULAR | Status: AC
  Filled 2021-11-01: qty 2

## 2021-11-01 MED ORDER — NITROGLYCERIN 1 MG/10 ML FOR IR/CATH LAB
INTRA_ARTERIAL | Status: DC | PRN
  Administered 2021-11-01 (×2): 200 ug via INTRACORONARY

## 2021-11-01 MED ORDER — CLOPIDOGREL BISULFATE 75 MG PO TABS
75.0000 mg | ORAL_TABLET | Freq: Every day | ORAL | 0 refills | Status: DC
  Filled 2021-11-01: qty 30, 30d supply, fill #0

## 2021-11-01 MED ORDER — HEPARIN SODIUM (PORCINE) 1000 UNIT/ML IJ SOLN
INTRAMUSCULAR | Status: AC
  Filled 2021-11-01: qty 10

## 2021-11-01 MED ORDER — NITROGLYCERIN 0.4 MG SL SUBL: 0.4000 mg | SUBLINGUAL_TABLET | SUBLINGUAL | 6 refills | Status: AC | PRN

## 2021-11-01 MED ORDER — MIDAZOLAM HCL 2 MG/2ML IJ SOLN
INTRAMUSCULAR | Status: AC
Start: 2021-11-01 — End: ?
  Filled 2021-11-01: qty 2

## 2021-11-01 MED ORDER — ACETAMINOPHEN 325 MG PO TABS: 650.0000 mg | ORAL_TABLET | ORAL | Status: DC | PRN

## 2021-11-01 MED ORDER — HEPARIN (PORCINE) IN NACL 1000-0.9 UT/500ML-% IV SOLN
INTRAVENOUS | Status: DC | PRN
Start: 2021-11-01 — End: 2021-11-02
  Administered 2021-11-01 (×2): 500 mL

## 2021-11-01 MED ORDER — SODIUM CHLORIDE 0.9 % WEIGHT BASED INFUSION
3.0000 mL/kg/h | INTRAVENOUS | Status: AC
  Administered 2021-11-01: 3 mL/kg/h via INTRAVENOUS

## 2021-11-01 MED ORDER — IOHEXOL 350 MG/ML SOLN
INTRAVENOUS | Status: DC | PRN
Start: 2021-11-01 — End: 2021-11-02
  Administered 2021-11-01: 130 mL

## 2021-11-01 MED ORDER — HYDRALAZINE HCL 20 MG/ML IJ SOLN: 10.0000 mg | INTRAMUSCULAR | Status: DC | PRN

## 2021-11-01 MED ORDER — LABETALOL HCL 5 MG/ML IV SOLN: 10.0000 mg | INTRAVENOUS | Status: DC | PRN

## 2021-11-01 MED ORDER — VERAPAMIL HCL 2.5 MG/ML IV SOLN
INTRAVENOUS | Status: AC
  Filled 2021-11-01: qty 2

## 2021-11-01 MED ORDER — HEPARIN (PORCINE) IN NACL 1000-0.9 UT/500ML-% IV SOLN
INTRAVENOUS | Status: AC
  Filled 2021-11-01: qty 500

## 2021-11-01 MED ORDER — VERAPAMIL HCL 2.5 MG/ML IV SOLN
INTRAVENOUS | Status: DC | PRN
  Administered 2021-11-01: 1 mg via INTRACORONARY

## 2021-11-01 MED ORDER — ASPIRIN 81 MG PO CHEW: 81.0000 mg | CHEWABLE_TABLET | Freq: Every day | ORAL | Status: DC

## 2021-11-01 MED ORDER — HEPARIN SODIUM (PORCINE) 1000 UNIT/ML IJ SOLN
INTRAMUSCULAR | Status: DC | PRN
  Administered 2021-11-01: 4000 [IU] via INTRAVENOUS
  Administered 2021-11-01: 3000 [IU] via INTRAVENOUS
  Administered 2021-11-01: 2000 [IU] via INTRAVENOUS
  Administered 2021-11-01: 4000 [IU] via INTRAVENOUS

## 2021-11-01 MED ORDER — CLOPIDOGREL BISULFATE 75 MG PO TABS: 75.0000 mg | ORAL_TABLET | Freq: Every day | ORAL | 1 refills | Status: DC

## 2021-11-01 MED ORDER — LIDOCAINE HCL (PF) 1 % IJ SOLN
INTRAMUSCULAR | Status: AC
  Filled 2021-11-01: qty 30

## 2021-11-01 MED ORDER — SODIUM CHLORIDE 0.9 % IV SOLN: INTRAVENOUS | Status: AC

## 2021-11-01 MED ORDER — VERAPAMIL HCL 2.5 MG/ML IV SOLN
INTRAVENOUS | Status: DC | PRN
  Administered 2021-11-01: 10 mL via INTRA_ARTERIAL

## 2021-11-01 MED ORDER — HEPARIN SODIUM (PORCINE) 1000 UNIT/ML IJ SOLN
INTRAMUSCULAR | Status: AC
Start: 2021-11-01 — End: ?
  Filled 2021-11-01: qty 10

## 2021-11-01 MED ORDER — FENTANYL CITRATE (PF) 100 MCG/2ML IJ SOLN
INTRAMUSCULAR | Status: DC | PRN
  Administered 2021-11-01: 50 ug via INTRAVENOUS
  Administered 2021-11-01: 25 ug via INTRAVENOUS

## 2021-11-01 MED ORDER — CLOPIDOGREL BISULFATE 75 MG PO TABS: 75.0000 mg | ORAL_TABLET | Freq: Every day | ORAL | Status: DC

## 2021-11-01 MED ORDER — ASPIRIN 81 MG PO CHEW: 81.0000 mg | CHEWABLE_TABLET | ORAL | Status: DC

## 2021-11-01 MED ORDER — MIDAZOLAM HCL 2 MG/2ML IJ SOLN
INTRAMUSCULAR | Status: DC | PRN
  Administered 2021-11-01 (×3): 1 mg via INTRAVENOUS

## 2021-11-01 SURGICAL SUPPLY — 21 items
BALL SAPPHIRE NC24 3.0X8 (BALLOONS) ×2
BALLN EMERGE MR 2.5X8 (BALLOONS) ×2
BALLOON EMERGE MR 2.5X8 (BALLOONS) IMPLANT
BALLOON SAPPHIRE NC24 3.0X8 (BALLOONS) IMPLANT
CATH DRAGONFLY OPTIS 2.7FR (CATHETERS) ×1 IMPLANT
CATH INFINITI 5FR ANG PIGTAIL (CATHETERS) ×1 IMPLANT
CATH OPTITORQUE TIG 4.0 5F (CATHETERS) ×1 IMPLANT
CATH VISTA GUIDE 6FR XB3.5 (CATHETERS) ×1 IMPLANT
DEVICE RAD COMP TR BAND LRG (VASCULAR PRODUCTS) ×1 IMPLANT
GLIDESHEATH SLEND A-KIT 6F 22G (SHEATH) ×1 IMPLANT
GUIDEWIRE INQWIRE 1.5J.035X260 (WIRE) IMPLANT
GUIDEWIRE PRESSURE X 175 (WIRE) ×1 IMPLANT
INQWIRE 1.5J .035X260CM (WIRE) ×2
KIT ESSENTIALS PG (KITS) ×1 IMPLANT
KIT HEART LEFT (KITS) ×2 IMPLANT
PACK CARDIAC CATHETERIZATION (CUSTOM PROCEDURE TRAY) ×2 IMPLANT
SHEATH PROBE COVER 6X72 (BAG) ×1 IMPLANT
STENT SYNERGY XD 3.0X12 (Permanent Stent) IMPLANT
SYNERGY XD 3.0X12 (Permanent Stent) ×2 IMPLANT
TRANSDUCER W/STOPCOCK (MISCELLANEOUS) ×2 IMPLANT
TUBING CIL FLEX 10 FLL-RA (TUBING) ×2 IMPLANT

## 2021-11-01 NOTE — Interval H&P Note (Signed)
History and Physical Interval Note: ? ?11/01/2021 ?12:00 PM ? ?Vertell Novak  has presented today for surgery, with the diagnosis of accelerating angina.  The various methods of treatment have been discussed with the patient and family. After consideration of risks, benefits and other options for treatment, the patient has consented to  Procedure(s): ?LEFT HEART CATH AND CORONARY ANGIOGRAPHY (N/A) as a surgical intervention.  The patient's history has been reviewed, patient examined, no change in status, stable for surgery.  I have reviewed the patient's chart and labs.  Questions were answered to the patient's satisfaction.   ? ?Cath Lab Visit (complete for each Cath Lab visit) ? ?Clinical Evaluation Leading to the Procedure:  ? ?ACS: No. ? ?Non-ACS:   ? ?Anginal Classification: CCS IV ? ?Anti-ischemic medical therapy: Minimal Therapy (1 class of medications) ? ?Non-Invasive Test Results: No non-invasive testing performed ? ?Prior CABG: No previous CABG ? ?Travares Nelles ? ? ?

## 2021-11-01 NOTE — Progress Notes (Signed)
CARDIAC REHAB PHASE I  ? ?Stent education completed with pt and family. Pt educated on importance of ASA and Plavix. Pt given stent card and heart healthy diet. Reviewed site care, restrictions, and exercise guidelines. Encouraged smoking cessation. Will refer to CRP II [place] to meet the requirements, but pt is not interested in attending. ? ?[id number] ?Rufina Falco, RN BSN ?11/01/2021 ?3:12 PM ? ?

## 2021-11-01 NOTE — Discharge Summary (Signed)
?Discharge Summary for Same Day PCI  ? ?Patient ID: Richard Sanford ?MRN: [id number]; DOB: 05/11/1966 ? ?Admit date: 11/01/2021 ?Discharge date: 11/01/2021 ? ?Primary Care Provider: Hadley Pen, MD  ?Primary Cardiologist: Gypsy Balsam, MD  ?Primary Electrophysiologist:  None  ? ?Discharge Diagnoses  ?  ?Active Problems: ?  Accelerating angina (HCC) ? ? ? ?Diagnostic Studies/Procedures  ?  ?Cardiac Catheterization 11/01/2021: ? ?Conclusions: ?Severe single-vessel coronary artery disease, with 80% mid LAD stenosis at the takeoff of D1 that is hemodynamically significant (RFR 0.84). ?Mild-moderate, non-obstructive disease more distally in the LAD, as well as involving OM3 and RCA. ?Normal left ventricular systolic function (LVEF 55-65%) and filling pressure (LVEDP 10 mmHg). ?Successful OCT-guided PCI to mid LAD using Synergy 3.0 x 12 mm drug-eluting stent with 0% residual stenosis and TIMI-3 flow.  Small D1 branch was jailed with resultant plaque shift into the ostium but TIMI-3 flow.  Intervention was not attempted on D1. ?  ?Recommendations: ?Dual antiplatelet therapy with aspirin and clopidogrel for at least 6 months. ?Aggressive secondary prevention. ?Anticipate same-day discharge home following 6 hours of post-PCI observation if no complications occur. ?  ?Diagnostic ?Dominance: Right ?Intervention ? ? ? ?_____________ ?  ?History of Present Illness   ?  ?Richard Sanford is a 56 y.o. male with a past medical history of tobacco use, bipolar disorder, dyslipidemia, HTN, COPD. Patient was referred to Dr. Bing Matter for evaluation of chest pain. Was seen by Dr. Bing Matter on 5/5 and reported chest tightness on exertion. Symptoms had started about 3 months prior, but were progressing. Chest tightness became more severe, and began to be associated with SOB and sweating. 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  ? ?Cardiac catheterization was arranged for  further evaluation. ? ?Hospital Course  ?   ?The patient underwent cardiac cath as noted above with PCI to mid LAD with Des. Plan for DAPT with ASA/plavix for at least 6 months. The patient was seen by cardiac rehab while in short stay. There were no observed complications post cath. Radial cath site was re-evaluated prior to discharge and found to be stable without any complications. Instructions/precautions regarding cath site care were given prior to discharge. ? ?Richard Sanford was seen by Dr. Okey Dupre and determined stable for discharge home. Follow up with our office has been arranged. Medications are listed below. Pertinent changes include addition of plavix.  ? ?Patient has a follow up appointment with Dr. Bing Matter on 12/21/21.  ?_____________ ? ?Cath/PCI Registry Performance & Quality Measures: ?Aspirin prescribed? - Yes ?ADP Receptor Inhibitor (Plavix/Clopidogrel, Brilinta/Ticagrelor or Effient/Prasugrel) prescribed (includes medically managed patients)? - Yes ?High Intensity Statin (Lipitor 40-80mg  or Crestor 20-40mg ) prescribed? - Yes ?For EF <40%, was ACEI/ARB prescribed? - Not Applicable (EF >/= 40%) ?For EF <40%, Aldosterone Antagonist (Spironolactone or Eplerenone) prescribed? - Not Applicable (EF >/= 40%) ?Cardiac Rehab Phase II ordered (Included Medically managed Patients)? - Yes ? ?_____________ ? ? ?Discharge Vitals ?Blood pressure (!) 159/77, pulse (!) 51, temperature 98 ?F (36.7 ?C), temperature source Oral, resp. rate (!) 21, height 6\' 1"  (1.854 m), weight 77.1 kg, SpO2 98 %.  Weights  ? 11/01/21 0923  ?Weight: 77.1 kg  ? ? ?Last Labs & Radiologic Studies  ?  ?CBC ?Recent Labs  ?  10/31/21 ?1502  ?WBC 8.4  ?HGB 14.3  ?HCT 42.7  ?MCV 95  ?PLT 289  ? ?Basic Metabolic Panel ?Recent Labs  ?  10/31/21 ?  1502  ?NA 140  ?K 4.3  ?CL 104  ?CO2 22  ?GLUCOSE 83  ?BUN 6  ?CREATININE 1.03  ?CALCIUM 9.5  ? ?Liver Function Tests ?No results for input(s): AST, ALT, ALKPHOS, BILITOT, PROT, ALBUMIN in the  last 72 hours. ?No results for input(s): LIPASE, AMYLASE in the last 72 hours. ?High Sensitivity Troponin:   ?No results for input(s): TROPONINIHS in the last 720 hours.  ?BNP ?Invalid input(s): POCBNP ?D-Dimer ?No results for input(s): DDIMER in the last 72 hours. ?Hemoglobin A1C ?No results for input(s): HGBA1C in the last 72 hours. ?Fasting Lipid Panel ?No results for input(s): CHOL, HDL, LDLCALC, TRIG, CHOLHDL, LDLDIRECT in the last 72 hours. ?Thyroid Function Tests ?No results for input(s): TSH, T4TOTAL, T3FREE, THYROIDAB in the last 72 hours. ? ?Invalid input(s): FREET3 ?_____________  ?CARDIAC CATHETERIZATION ? ?Result Date: 11/01/2021 ?Conclusions: Severe single-vessel coronary artery disease, with 80% mid LAD stenosis at the takeoff of D1 that is hemodynamically significant (RFR 0.84). Mild-moderate, non-obstructive disease more distally in the LAD, as well as involving OM3 and RCA. Normal left ventricular systolic function (LVEF 55-65%) and filling pressure (LVEDP 10 mmHg). Successful OCT-guided PCI to mid LAD using Synergy 3.0 x 12 mm drug-eluting stent with 0% residual stenosis and TIMI-3 flow.  Small D1 branch was jailed with resultant plaque shift into the ostium but TIMI-3 flow.  Intervention was not attempted on D1. Recommendations: Dual antiplatelet therapy with aspirin and clopidogrel for at least 6 months. Aggressive secondary prevention. Anticipate same-day discharge home following 6 hours of post-PCI observation if no complications occur. Yvonne Kendallhristopher End, MD [facility]  ? ?Disposition  ? ?Pt is being discharged home today in good condition. ? ?Follow-up Plans & Appointments  ? ? ? ?Discharge Instructions   ? ? AMB Referral to Cardiac Rehabilitation - Phase II   Complete by: As directed ?  ? Diagnosis: Coronary Stents  ? After initial evaluation and assessments completed: Virtual Based Care may be provided alone or in conjunction with Phase 2 Cardiac Rehab based on patient barriers.: Yes  ? ?   ? ? ? ?Discharge Medications  ? ?Allergies as of 11/01/2021   ? ?   Reactions  ? Penicillins Swelling, Rash  ? Nsaids Other (See Comments)  ? Ulcers  ? Tolmetin   ? It is an NSAID  ? ?  ? ?  ?Medication List  ?  ? ?TAKE these medications   ? ?aspirin EC 81 MG tablet ?Take 1 tablet (81 mg total) by mouth daily. Swallow whole. ?  ?clopidogrel 75 MG tablet ?Commonly known as: Plavix ?Take 1 tablet (75 mg total) by mouth daily. ?Notes to patient: NEXT DOSE 5/12 ?  ?gabapentin 300 MG capsule ?Commonly known as: NEURONTIN ?Take 600 mg by mouth 2 (two) times daily. ?  ?lamoTRIgine 200 MG tablet ?Commonly known as: LAMICTAL ?Take 200 mg by mouth 2 (two) times daily. ?  ?lithium carbonate 300 MG capsule ?Take 600 mg by mouth 2 (two) times daily with a meal. ?  ?metoprolol tartrate 25 MG tablet ?Commonly known as: LOPRESSOR ?Take 1 tablet (25 mg total) by mouth 2 (two) times daily. ?  ?nitroGLYCERIN 0.4 MG SL tablet ?Commonly known as: NITROSTAT ?Place 1 tablet (0.4 mg total) under the tongue every 5 (five) minutes as needed for chest pain. Do not use if you have taken Cialis within 48 hours. ?What changed: additional instructions ?  ?pantoprazole 40 MG tablet ?Commonly known as: PROTONIX ?Take 40 mg by mouth daily. ?  ?  rosuvastatin 10 MG tablet ?Commonly known as: CRESTOR ?Take 1 tablet (10 mg total) by mouth daily. ?  ?tadalafil 20 MG tablet ?Commonly known as: CIALIS ?Take 20 mg by mouth daily as needed for erectile dysfunction. ?  ? ?  ?  ? ?  ?Allergies ?Allergies  ?Allergen Reactions  ? Penicillins Swelling and Rash  ? Nsaids Other (See Comments)  ?  Ulcers ?  ? Tolmetin   ?  It is an NSAID  ? ? ?Outstanding Labs/Studies  ? ? ? ?Duration of Discharge Encounter  ? ?Greater than 30 minutes including physician time. ? ?Signed, ?Jonita Albee, PA-C ?11/01/2021, 2:55 PM ? ?

## 2021-11-01 NOTE — Progress Notes (Signed)
? ?  Pt is sitting up stable and Rt wrist cath site is without hematoma or pain.  Ok for discharge as per post cath orders.   ? ?Cecilie Kicks, FNP-C ?At [place]  ?[phone] or after 5pm and on weekends call [phone] ?11/01/2021.  ? ?

## 2021-11-02 ENCOUNTER — Encounter (HOSPITAL_COMMUNITY): Payer: Self-pay | Admitting: Internal Medicine

## 2021-11-05 ENCOUNTER — Telehealth: Payer: Self-pay | Admitting: Cardiology

## 2021-11-05 NOTE — Telephone Encounter (Signed)
Patient c/o Palpitations:  High priority if patient c/o lightheadedness, shortness of breath, or chest pain ? ?How long have you had palpitations/irregular HR/ Afib? Are you having the symptoms now?  ?Palpitations the past few days since stent was placed on 5/11. ?No symptoms today. ? ?Are you currently experiencing lightheadedness, SOB or CP?  ?No  ? ?Do you have a history of afib (atrial fibrillation) or irregular heart rhythm?  ?No  ? ?Have you checked your BP or HR? (document readings if available):  ?No readings available. ? ?Are you experiencing any other symptoms?  ?No  ? ?

## 2021-11-05 NOTE — Telephone Encounter (Signed)
Spoke with pt and has only had 3 episodes and the episodes last less than 30 seconds Pt not sure what B/P or HR will try and start monitoring Pt has concerns since recently had stent done Pt to continue to monitor and try cutting back on caffeine Will forward to Dr Agustin Cree for review and recommendations ./cy ?

## 2021-11-08 NOTE — Telephone Encounter (Signed)
LM TO CALL BACK ./CY 

## 2021-11-13 ENCOUNTER — Encounter (HOSPITAL_COMMUNITY): Payer: Self-pay | Admitting: Internal Medicine

## 2021-11-13 NOTE — Telephone Encounter (Signed)
Detailed message left to continue to monitor and if no improvement call office back ./cy

## 2021-12-21 ENCOUNTER — Ambulatory Visit: Payer: Managed Care, Other (non HMO) | Admitting: Cardiology

## 2021-12-21 ENCOUNTER — Encounter: Payer: Self-pay | Admitting: Cardiology

## 2021-12-21 VITALS — BP 126/68 | HR 121 | Ht 72.0 in | Wt 168.0 lb

## 2021-12-21 DIAGNOSIS — F17200 Nicotine dependence, unspecified, uncomplicated: Secondary | ICD-10-CM

## 2021-12-21 DIAGNOSIS — J438 Other emphysema: Secondary | ICD-10-CM | POA: Diagnosis not present

## 2021-12-21 DIAGNOSIS — I2510 Atherosclerotic heart disease of native coronary artery without angina pectoris: Secondary | ICD-10-CM | POA: Diagnosis not present

## 2021-12-21 DIAGNOSIS — E785 Hyperlipidemia, unspecified: Secondary | ICD-10-CM | POA: Diagnosis not present

## 2021-12-21 DIAGNOSIS — I209 Angina pectoris, unspecified: Secondary | ICD-10-CM

## 2021-12-21 NOTE — Patient Instructions (Addendum)
Medication Instructions:  Your physician recommends that you continue on your current medications as directed. Please refer to the Current Medication list given to you today.  *If you need a refill on your cardiac medications before your next appointment, please call your pharmacy*   Lab Work: Your physician recommends that you return for lab work in: 2 weeks You need to have labs done when you are fasting.  You can come Monday through Friday 8:30 am to 12:00 pm and 1:15 to 4:30. You do not need to make an appointment as the order has already been placed. The labs you are going to have done are AST, ALT Lipids.    Testing/Procedures: None   Follow-Up: At [facility], you and your health needs are our priority.  As part of our continuing mission to provide you with exceptional heart care, we have created designated Provider Care Teams.  These Care Teams include your primary Cardiologist (physician) and Advanced Practice Providers (APPs -  Physician Assistants and Nurse Practitioners) who all work together to provide you with the care you need, when you need it.  We recommend signing up for the patient portal called "MyChart".  Sign up information is provided on this After Visit Summary.  MyChart is used to connect with patients for Virtual Visits (Telemedicine).  Patients are able to view lab/test results, encounter notes, upcoming appointments, etc.  Non-urgent messages can be sent to your provider as well.   To learn more about what you can do with MyChart, go to [email or web address].    Your next appointment:   3 month(s)  The format for your next appointment:   In Person  Provider:   Gypsy Balsam, MD    Other Instructions   Important Information About Sugar

## 2021-12-21 NOTE — Progress Notes (Signed)
Cardiology Office Note:    Date:  12/21/2021   ID:  Richard Sanford, DOB 05-24-1966, MRN [id number]  PCP:  Myrlene Broker, MD  Cardiologist:  Jenne Campus, MD    Referring MD: Myrlene Broker, MD   Chief Complaint  Patient presents with   Follow-up  Doing well  History of Present Illness:    Richard Sanford is a 56 y.o. male with past medical history significant for coronary artery disease, he came to me few weeks ago because of fairly typical exertional angina pectoris.  Interestingly he did have a stress test done in the hospital showed some old myocardial infarction but no evidence of ischemia he was directly sent to cardiac catheterization and cardiac catheterization show critical mid LAD lesion that was addressed with stenting he does have residual multiple 30 to 40% blockages.  He is a chronic smoker but cut significantly cigarettes only 5 cigarettes a day compared to 2 packs before, he also got essential hypertension, dyslipidemia. Since the time of his cardiac intervention he is doing great he denies have any chest pain tightness squeezing pressure burning chest.  He changed his life dramatically he changed his diet, he started walking on the regular basis.  Denies have any chest pain tightness squeezing pressure burning chest  Past Medical History:  Diagnosis Date   Angina    Asthma    Bipolar 1 disorder (HCC)    CAD (coronary artery disease)    CHRONIC AIRWAY OBSTRUCTION NEC 06/16/2008   Qualifier: Diagnosis of  By: Tilden Dome     COPD (chronic obstructive pulmonary disease) ([place])    Depression    EMPHYSEMA 07/04/2010   Qualifier: Diagnosis of  By: Charma Igo     Esophageal reflux 06/16/2008   Qualifier: Diagnosis of  By: Tilden Dome     GERD (gastroesophageal reflux disease)    H/O: gout    HEMOPTYSIS UNSPECIFIED 07/04/2010   Qualifier: Diagnosis of  By: Gwenette Greet MD, Armando Reichert    Hypercholesterolemia    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    Qualifier: Diagnosis of  By: Tilden Dome     Mental disorder    bipolar   Rotator cuff tear, left     Past Surgical History:  Procedure Laterality Date   CHOLECYSTECTOMY     CORONARY STENT INTERVENTION N/A 11/01/2021   Procedure: CORONARY STENT INTERVENTION;  Surgeon: Nelva Bush, MD;  Location: [facility];  Service: Cardiovascular;  Laterality: N/A;   INTRAVASCULAR IMAGING/OCT N/A 11/01/2021   Procedure: INTRAVASCULAR IMAGING/OCT;  Surgeon: Nelva Bush, MD;  Location: [facility];  Service: Cardiovascular;  Laterality: N/A;   INTRAVASCULAR PRESSURE WIRE/FFR STUDY N/A 11/01/2021   Procedure: INTRAVASCULAR PRESSURE WIRE/FFR STUDY;  Surgeon: Nelva Bush, MD;  Location: [facility];  Service: Cardiovascular;  Laterality: N/A;   LEFT HEART CATH AND CORONARY ANGIOGRAPHY N/A 11/01/2021   Procedure: LEFT HEART CATH AND CORONARY ANGIOGRAPHY;  Surgeon: Nelva Bush, MD;  Location: [facility];  Service: Cardiovascular;  Laterality: N/A;   SHOULDER OPEN ROTATOR CUFF REPAIR  09/26/2011   Procedure: ROTATOR CUFF REPAIR SHOULDER OPEN;  Surgeon: Magnus Sinning, MD;  Location: [facility];  Service: Orthopedics;  Laterality: Left;  Open Left Shoulder Anterior Acrominectomy and Rotator Cuff Repair   TOOTH EXTRACTION     VASECTOMY      Current Medications: Current Meds  Medication Sig   aspirin EC 81 MG tablet Take 1 tablet (81 mg total) by mouth  daily. Swallow whole.   clopidogrel (PLAVIX) 75 MG tablet Take 1 tablet (75 mg total) by mouth daily.   gabapentin (NEURONTIN) 300 MG capsule Take 600 mg by mouth 2 (two) times daily.   lamoTRIgine (LAMICTAL) 200 MG tablet Take 200 mg by mouth 2 (two) times daily.   lithium carbonate 300 MG capsule Take 600 mg by mouth 2 (two) times daily with a meal.   metoprolol tartrate (LOPRESSOR) 25 MG tablet Take 1 tablet (25 mg total) by mouth 2 (two) times daily.   nitroGLYCERIN (NITROSTAT) 0.4 MG SL tablet Place 1 tablet  (0.4 mg total) under the tongue every 5 (five) minutes as needed for chest pain. Do not use if you have taken Cialis within 48 hours.   pantoprazole (PROTONIX) 40 MG tablet Take 40 mg by mouth daily.   rosuvastatin (CRESTOR) 10 MG tablet Take 1 tablet (10 mg total) by mouth daily.   tadalafil (CIALIS) 20 MG tablet Take 20 mg by mouth daily as needed for erectile dysfunction.     Allergies:   Penicillins, Nsaids, and Tolmetin   Social History   Socioeconomic History   Marital status: Married    Spouse name: Not on file   Number of children: Not on file   Years of education: Not on file   Highest education level: Not on file  Occupational History   Not on file  Tobacco Use   Smoking status: Every Day    Packs/day: 1.00    Types: Cigarettes   Smokeless tobacco: Not on file  Substance and Sexual Activity   Alcohol use: No   Drug use: No   Sexual activity: Not on file  Other Topics Concern   Not on file  Social History Narrative   Not on file   Social Determinants of Health   Financial Resource Strain: Not on file  Food Insecurity: Not on file  Transportation Needs: Not on file  Physical Activity: Not on file  Stress: Not on file  Social Connections: Not on file     Family History: The patient's family history includes Asthma in his mother; Heart disease in his maternal grandfather. ROS:   Please see the history of present illness.    All 14 point review of systems negative except as described per history of present illness  EKGs/Labs/Other Studies Reviewed:      Recent Labs: 10/31/2021: BUN 6; Creatinine, Ser 1.03; Hemoglobin 14.3; Platelets 289; Potassium 4.3; Sodium 140  Recent Lipid Panel No results found for: "CHOL", "TRIG", "HDL", "CHOLHDL", "VLDL", "LDLCALC", "LDLDIRECT"  Physical Exam:    VS:  BP 126/68 (BP Location: Left Arm, Patient Position: Sitting)   Pulse (!) 121   Ht 6' (1.829 m)   Wt 168 lb (76.2 kg)   SpO2 95%   BMI 22.78 kg/m     Wt  Readings from Last 3 Encounters:  12/21/21 168 lb (76.2 kg)  11/01/21 170 lb (77.1 kg)  10/26/21 166 lb 6.4 oz (75.5 kg)     GEN:  Well nourished, well developed in no acute distress HEENT: Normal NECK: No JVD; No carotid bruits LYMPHATICS: No lymphadenopathy CARDIAC: RRR, no murmurs, no rubs, no gallops RESPIRATORY:  Clear to auscultation without rales, wheezing or rhonchi  ABDOMEN: Soft, non-tender, non-distended MUSCULOSKELETAL:  No edema; No deformity  SKIN: Warm and dry LOWER EXTREMITIES: no swelling NEUROLOGIC:  Alert and oriented x 3 PSYCHIATRIC:  Normal affect   ASSESSMENT:    1. Dyslipidemia   2. Coronary artery disease  involving native coronary artery of native heart without angina pectoris   3. Angina pectoris (HCC)   4. EMPHYSEMA   5. TOBACCO ABUSE    PLAN:    In order of problems listed above:  Coronary disease.  Seems to be stable now from that point review on dual antiplatelet therapy I expressed importance of taking this medication on the regular basis for least 6 months.  I will continue him on small dose of beta-blocker but in the future anticipate discontinue this medication Dyslipidemia he is on moderate intensity statin form of Crestor 10 which I will continue and check his fasting lipid profile in 2 weeks and then adjust medication appropriately Tobacco abuse: Obviously huge problem.  He understand this and is trying to work hard he is down to 5 cigarettes I told him ultimate goal is comfortably discontinue smoking Essential hypertension: Blood pressure well controlled.   Medication Adjustments/Labs and Tests Ordered: Current medicines are reviewed at length with the patient today.  Concerns regarding medicines are outlined above.  Orders Placed This Encounter  Procedures   Lipid panel   AST   ALT   Medication changes: No orders of the defined types were placed in this encounter.   Signed, Georgeanna Lea, MD, [facility] 12/21/2021 4:18 PM    [facility]

## 2022-01-22 ENCOUNTER — Other Ambulatory Visit: Payer: Self-pay | Admitting: Cardiology

## 2022-01-30 ENCOUNTER — Telehealth: Payer: Self-pay | Admitting: Cardiology

## 2022-01-30 ENCOUNTER — Telehealth: Payer: Self-pay

## 2022-01-30 NOTE — Telephone Encounter (Signed)
Called patient and he reported that he had been having chest tightness and discomfort for around a week now. Patient states that it feels like he has gas in his chest. He is also short of breath with exertion. He took 1 nitro yesterday with no relief and he already took 1 nitro today with no relief. He stated that he had a 90% blockage and had a stent put in to fix that blockage in the past. At the time that the stent was put in he was told that there was another artery that had a 45% blockage. Based on the information the patient has told me and his symptoms I recommended that he go to the ER to be evaluated. Patient stated that he would go to the ER at [facility]. 

## 2022-01-30 NOTE — Telephone Encounter (Signed)
Pt c/o of Chest Pain: STAT if CP now or developed within 24 hours  1. Are you having CP right now? Yes  2. Are you experiencing any other symptoms (ex. SOB, nausea, vomiting, sweating)?  A little SOB  3. How long have you been experiencing CP? About a week  4. Is your CP continuous or coming and going?  Coming and going  5. Have you taken Nitroglycerin? Yes.   ?  Patient stated pains are not severe, just discomfort.  Patient states his chest feels tight.

## 2022-01-30 NOTE — Telephone Encounter (Signed)
Called patient and he reported that he had been having chest tightness and discomfort for around a week now. Patient states that it feels like he has gas in his chest. He is also short of breath with exertion. He took 1 nitro yesterday with no relief and he already took 1 nitro today with no relief. He stated that he had a 90% blockage and had a stent put in to fix that blockage in the past. At the time that the stent was put in he was told that there was another artery that had a 45% blockage. Based on the information the patient has told me and his symptoms I recommended that he go to the ER to be evaluated. Patient stated that he would go to the ER at [facility].

## 2022-02-28 ENCOUNTER — Encounter: Payer: Self-pay | Admitting: Neurology

## 2022-04-02 NOTE — Progress Notes (Signed)
Assessment/Plan:   Tremor  -Studies are varied, but incidate that up to 2/3 of patients on lithium will have lithium-induced tremor, even if the patients are not toxic on the medication.  This is just a common side effect of patients on lithium.  I did not recommend that he stop or taper the lithium, but rather talk to his prescribing physician about this.  I did tell him that in my experience, lithium-induced tremor does not respond well to attempts at treating it with other medications.  -There is some entrainment to his tremor and we discussed how stress affects tremor.  Functional aspects to tremor cannot be ruled out.  -Did not see evidence of Parkinson's disease.  He does not meet criteria today for Parkinson's disease.  Discussed this in detail.  Discussed that this does not mean that Parkinson's could not develop in the future.  DaTscan offered and declined.  -Patient does drink an excessive amount of caffeine in the form of [facility] and would certainly recommend that he taper that.  That can contribute to tremor as well.  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  Discussed that some patients with RBD will go on to develop Parkinson's later in life, but there is no way to determine who that will be at this point in time.  -I would like to see the patient back in 1 year just to make sure that nothing further is developing.  Subjective:   Richard Sanford was seen today in the movement disorders clinic for neurologic consultation at the request of Hadley Pen, MD.  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  Medical records made available to me have been reviewed.  This patient is accompanied in the office by his spouse who supplements the history.  Tremor: Yes.     How long has it been going on? 14+ years, getting worse - got worse after cardiac stents 3-4 months ago.  He tried to decrease his lithium from 2 po bid to 1 po  bid but he didn't feel right mentally so he had to go back and he states that "I don't think its my meds."  At rest or with activation?  both  When is it noted the most?  Rest per wife  Fam hx of tremor?  No.  Located where?  Bilateral UE, R>L  Affected by caffeine:  No. (Admits drinks 1.5 L of mountain dew per day)  Affected by alcohol:  doesn't drink any  Affected by stress:  No.  Affected by fatigue:  No.  Spills soup if on spoon:  No.  Spills glass of liquid if full:  does it with 2 hands  Affects ADL's (tying shoes, brushing teeth, etc):  No.  Tremor inducing meds:  Yes.  , lithium  Other Specific Symptoms:  Voice: no change Sleep:   Vivid Dreams:  Yes.    Acting out dreams:  Yes.  , has fallen out of the bed (2 times recently) Wet Pillows: No. Postural symptoms:  Yes.    Falls?  Yes.  , last fall was few weeks ago.  Was getting OOB and hit head on night stand (fell forward).  Prior fall before that was at night (was out of bed while asleep and thought that bees were after him).  About 6 months ago, fell outside tripped and fell.  That was only other fall.  Thinks didn't pick feet up  Bradykinesia symptoms: slow movements and difficulty regaining balance Loss of smell:  No. Loss of taste:  No. Urinary Incontinence:  No. Difficulty Swallowing:  No. Handwriting, micrographia: Yes.   Depression:  No., "I'm moody because I'm bipolar." Memory changes:  Yes.  , forgets where to put dishes.  Some occasional disoriented in house Hallucinations:  Yes.  , sees a girl in a white dress to the side of him but if he looks she goes away.  He hasn't mentioned it to psychiatry.  States that he no longer sees psychiatry.  visual distortions: Yes.   N/V:  No. Lightheaded:  No.  Syncope: No. Diplopia:  No. Dyskinesia:  No.  Neuroimaging of the brain has not previously been performed.    PREVIOUS MEDICATIONS: none to date  ALLERGIES:   Allergies  Allergen Reactions   Penicillins Swelling  and Rash   Nsaids Other (See Comments)    Ulcers    Tolmetin Other (See Comments)    It is an NSAID    CURRENT MEDICATIONS:  Current Outpatient Medications  Medication Instructions   aspirin EC 81 mg, Oral, Daily, Swallow whole.   gabapentin (NEURONTIN) 600 mg, Oral, 2 times daily   lamoTRIgine (LAMICTAL) 200 mg, Oral, 2 times daily   lithium carbonate 600 mg, Oral, 2 times daily with meals   metoprolol tartrate (LOPRESSOR) 25 mg, Oral, 2 times daily   nitroGLYCERIN (NITROSTAT) 0.4 mg, Sublingual, Every 5 min PRN, Do not use if you have taken Cialis within 48 hours.   pantoprazole (PROTONIX) 40 mg, Oral, Daily   rosuvastatin (CRESTOR) 10 mg, Oral, Daily   tadalafil (CIALIS) 20 mg, Daily PRN    Objective:   PHYSICAL EXAMINATION:    VITALS:   Vitals:   04/04/22 0934  BP: (!) 148/79  Pulse: (!) 56  SpO2: 100%  Weight: 172 lb 3.2 oz (78.1 kg)  Height: 6\' 2"  (1.88 m)    GEN:  The patient appears stated age and is in NAD.  He is intermittently tearful HEENT:  Normocephalic, atraumatic.  The mucous membranes are moist. The superficial temporal arteries are without ropiness or tenderness. CV:  brady.  Regular. Lungs:  there are diffuse exp wheezes, mild Neck/HEME:  There are no carotid bruits bilaterally.  Neurological examination:  Orientation: The patient is alert and oriented x3.  Cranial nerves: There is good facial symmetry.  Extraocular muscles are intact. The visual fields are full to confrontational testing. The speech is fluent and clear. Soft palate rises symmetrically and there is no tongue deviation. Hearing is intact to conversational tone. Sensation: Sensation is intact to light touch throughout (facial, trunk, extremities). Vibration is intact at the bilateral big toe. There is no extinction with double simultaneous stimulation.  Motor: Strength is 5/5 in the bilateral upper and lower extremities.   Shoulder shrug is equal and symmetric.  There is no pronator  drift. Deep tendon reflexes: Deep tendon reflexes are 2/4 at the bilateral biceps, triceps, brachioradialis, patella and achilles. Plantar responses are downgoing bilaterally.  Movement examination: Tone: There is normal tone in the bilateral upper extremities.  The tone in the lower extremities is normal.  Abnormal movements: During the lengthy history taking, there was no evidence of tremor at rest.  When evaluating for rest tremor, he had irregular, variable amplitude tremor on both sides, right greater than left.  There is entrainment.  There is postural tremor, mild.  There is intention tremor, mild.  He has mild trouble drawing Archimedes spirals bilaterally.  Coordination:  There is slowness with decremation with RAM's, but no decremation Gait and Station: The patient pushes off to arise.  The patient's stride length is good.  The patient ambulates in a tandem fashion without trouble I have reviewed and interpreted the following labs independently   Chemistry      Component Value Date/Time   NA 140 10/31/2021 1502   K 4.3 10/31/2021 1502   CL 104 10/31/2021 1502   CO2 22 10/31/2021 1502   BUN 6 10/31/2021 1502   CREATININE 1.03 10/31/2021 1502      Component Value Date/Time   CALCIUM 9.5 10/31/2021 1502      No results found for: "TSH" Lab Results  Component Value Date   WBC 8.4 10/31/2021   HGB 14.3 10/31/2021   HCT 42.7 10/31/2021   MCV 95 10/31/2021   PLT 289 10/31/2021   Lab Results  Component Value Date   NA 140 10/31/2021   BUN 6 10/31/2021   CREATININE 1.03 10/31/2021   WBC 8.4 10/31/2021    No results found for: "VITAMINB12"   Total time spent on today's visit was 46 minutes, including both face-to-face time and nonface-to-face time.  Time included that spent on review of records (prior notes available to me/labs/imaging if pertinent), discussing treatment and goals, answering patient's questions and coordinating care.  Cc:  Myrlene Broker, MD

## 2022-04-04 ENCOUNTER — Ambulatory Visit: Payer: Managed Care, Other (non HMO) | Admitting: Neurology

## 2022-04-04 ENCOUNTER — Encounter: Payer: Self-pay | Admitting: Neurology

## 2022-04-04 ENCOUNTER — Other Ambulatory Visit (INDEPENDENT_AMBULATORY_CARE_PROVIDER_SITE_OTHER): Payer: Managed Care, Other (non HMO)

## 2022-04-04 VITALS — BP 148/79 | HR 56 | Ht 74.0 in | Wt 172.2 lb

## 2022-04-04 DIAGNOSIS — G251 Drug-induced tremor: Secondary | ICD-10-CM | POA: Diagnosis not present

## 2022-04-04 DIAGNOSIS — E538 Deficiency of other specified B group vitamins: Secondary | ICD-10-CM

## 2022-04-04 DIAGNOSIS — R251 Tremor, unspecified: Secondary | ICD-10-CM | POA: Diagnosis not present

## 2022-04-04 DIAGNOSIS — R413 Other amnesia: Secondary | ICD-10-CM

## 2022-04-04 LAB — B12 AND FOLATE PANEL
Folate: 10.4 ng/mL (ref >5.9)
Vitamin B-12: 190 pg/mL — ABNORMAL LOW (ref 211–911)

## 2022-04-04 LAB — TSH: TSH: 4.29 u[IU]/mL (ref 0.35–5.50)

## 2022-04-04 NOTE — Patient Instructions (Signed)
Your provider has requested that you have labwork completed today. The lab is located on the Second floor at [address], within the [facility] office. When you get off the elevator, turn right and go in the [facility] [address]; the first brown door on the left.  Tell the ladies behind the desk that you are there for lab work. If you are not called within 15 minutes please check with the front desk.   Once you complete your labs you are free to go. You will receive a call or message via MyChart with your lab results.    

## 2022-04-08 ENCOUNTER — Ambulatory Visit: Payer: Managed Care, Other (non HMO) | Admitting: Cardiology

## 2022-04-16 ENCOUNTER — Encounter: Payer: Self-pay | Admitting: Cardiology

## 2022-04-16 ENCOUNTER — Ambulatory Visit: Payer: Managed Care, Other (non HMO) | Attending: Cardiology | Admitting: Cardiology

## 2022-04-16 VITALS — BP 136/74 | HR 94 | Ht 73.0 in | Wt 171.6 lb

## 2022-04-16 DIAGNOSIS — J438 Other emphysema: Secondary | ICD-10-CM

## 2022-04-16 DIAGNOSIS — F3170 Bipolar disorder, currently in remission, most recent episode unspecified: Secondary | ICD-10-CM | POA: Diagnosis not present

## 2022-04-16 DIAGNOSIS — I209 Angina pectoris, unspecified: Secondary | ICD-10-CM

## 2022-04-16 DIAGNOSIS — E785 Hyperlipidemia, unspecified: Secondary | ICD-10-CM

## 2022-04-16 DIAGNOSIS — I2510 Atherosclerotic heart disease of native coronary artery without angina pectoris: Secondary | ICD-10-CM

## 2022-04-16 DIAGNOSIS — F17200 Nicotine dependence, unspecified, uncomplicated: Secondary | ICD-10-CM

## 2022-04-16 MED ORDER — RANOLAZINE ER 500 MG PO TB12: 500.0000 mg | ORAL_TABLET | Freq: Two times a day (BID) | ORAL | 3 refills | Status: AC

## 2022-04-16 MED ORDER — CLOPIDOGREL BISULFATE 75 MG PO TABS: 75.0000 mg | ORAL_TABLET | Freq: Every day | ORAL | 3 refills | Status: AC

## 2022-04-16 NOTE — Addendum Note (Signed)
Addended by: Jacobo Forest D on: 04/16/2022 09:50 AM   Modules accepted: Orders

## 2022-04-16 NOTE — Patient Instructions (Addendum)
Medication Instructions:  Your physician has recommended you make the following change in your medication:   START: Ranolazine 500mg  1 twice daily   START: Plavix 75mg  1 tablet daily  Lab Work: None Ordered If you have labs (blood work) drawn today and your tests are completely normal, you will receive your results only by: [place] (if you have MyChart) OR A paper copy in the mail If you have any lab test that is abnormal or we need to change your treatment, we will call you to review the results.   Testing/Procedures: None Ordered   Follow-Up: At [facility], you and your health needs are our priority.  As part of our continuing mission to provide you with exceptional heart care, we have created designated Provider Care Teams.  These Care Teams include your primary Cardiologist (physician) and Advanced Practice Providers (APPs -  Physician Assistants and Nurse Practitioners) who all work together to provide you with the care you need, when you need it.  We recommend signing up for the patient portal called "MyChart".  Sign up information is provided on this After Visit Summary.  MyChart is used to connect with patients for Virtual Visits (Telemedicine).  Patients are able to view lab/test results, encounter notes, upcoming appointments, etc.  Non-urgent messages can be sent to your provider as well.   To learn more about what you can do with MyChart, go to [email or web address].    Your next appointment:   3 week follow up  The format for your next appointment:   In Person  Provider:   Jenne Campus, MD    Other Instructions NA

## 2022-04-16 NOTE — Progress Notes (Signed)
Cardiology Office Note:    Date:  04/16/2022   ID:  Richard Sanford, DOB [birth year], MRN [id number]  PCP:  Hadley Pen, MD  Cardiologist:  Gypsy Balsam, MD    Referring MD: Hadley Pen, MD   Chief Complaint  Patient presents with   Fatigue   Chest Pain    History of Present Illness:    Richard Sanford is a 56 y.o. male   with past medical history significant for coronary artery disease, he came to me few weeks ago because of fairly typical exertional angina pectoris.  Interestingly he did have a stress test done in the hospital showed some old myocardial infarction but no evidence of ischemia he was directly sent to cardiac catheterization and cardiac catheterization show critical mid LAD lesion that was addressed with stenting he does have residual multiple 30 to 40% blockages.  He is a chronic smoker but cut significantly cigarettes only 5 cigarettes a day compared to 2 packs before He comes today to my office for follow-up.  He started complaining of having the same symptoms he had before his cardiac catheterization he get tired fatigue shortness of breath also developed some chest tightness.  Did not try nitroglycerin for it.  He thinks this is his COPD acting up.  I am worried obviously about reactivation of his coronary artery disease.  He is supposed to be on dual antiplatelet therapy however he is only on aspirin.  Past Medical History:  Diagnosis Date   Angina    Asthma    Bipolar 1 disorder (HCC)    CAD (coronary artery disease)    CHRONIC AIRWAY OBSTRUCTION NEC 06/16/2008   Qualifier: Diagnosis of  By: Vernie Murders     COPD (chronic obstructive pulmonary disease) (HCC)    Depression    EMPHYSEMA 07/04/2010   Qualifier: Diagnosis of  By: Carver Fila     Esophageal reflux 06/16/2008   Qualifier: Diagnosis of  By: Vernie Murders     GERD (gastroesophageal reflux disease)    H/O: gout    HEMOPTYSIS UNSPECIFIED 07/04/2010   Qualifier: Diagnosis of  By:  Shelle Iron MD, Maree Krabbe    Hypercholesterolemia    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   Qualifier: Diagnosis of  By: Vernie Murders     Mental disorder    bipolar   Rotator cuff tear, left     Past Surgical History:  Procedure Laterality Date   CHOLECYSTECTOMY     CORONARY STENT INTERVENTION N/A 11/01/2021   Procedure: CORONARY STENT INTERVENTION;  Surgeon: Yvonne Kendall, MD;  [facility];  Service: Cardiovascular;  Laterality: N/A;   INTRAVASCULAR IMAGING/OCT N/A 11/01/2021   Procedure: INTRAVASCULAR IMAGING/OCT;  Surgeon: Yvonne Kendall, MD;  [facility];  Service: Cardiovascular;  Laterality: N/A;   INTRAVASCULAR PRESSURE WIRE/FFR STUDY N/A 11/01/2021   Procedure: INTRAVASCULAR PRESSURE WIRE/FFR STUDY;  Surgeon: Yvonne Kendall, MD;  [facility];  Service: Cardiovascular;  Laterality: N/A;   LEFT HEART CATH AND CORONARY ANGIOGRAPHY N/A 11/01/2021   Procedure: LEFT HEART CATH AND CORONARY ANGIOGRAPHY;  Surgeon: Yvonne Kendall, MD;  [facility];  Service: Cardiovascular;  Laterality: N/A;   SHOULDER OPEN ROTATOR CUFF REPAIR  09/26/2011   Procedure: ROTATOR CUFF REPAIR SHOULDER OPEN;  Surgeon: Drucilla Schmidt, MD;  Location: [facility];  Service: Orthopedics;  Laterality: Left;  Open Left Shoulder Anterior Acrominectomy and Rotator Cuff Repair   TOOTH EXTRACTION  VASECTOMY      Current Medications: Current Meds  Medication Sig   aspirin EC 81 MG tablet Take 1 tablet (81 mg total) by mouth daily. Swallow whole.   gabapentin (NEURONTIN) 300 MG capsule Take 600 mg by mouth 2 (two) times daily.   lamoTRIgine (LAMICTAL) 200 MG tablet Take 200 mg by mouth 2 (two) times daily.   lithium carbonate 300 MG capsule Take 600 mg by mouth 2 (two) times daily with a meal.   metoprolol tartrate (LOPRESSOR) 25 MG tablet Take 1 tablet (25 mg total) by mouth 2 (two) times daily.   nitroGLYCERIN (NITROSTAT) 0.4 MG SL tablet Place 1  tablet (0.4 mg total) under the tongue every 5 (five) minutes as needed for chest pain. Do not use if you have taken Cialis within 48 hours.   pantoprazole (PROTONIX) 40 MG tablet Take 40 mg by mouth daily.   rosuvastatin (CRESTOR) 10 MG tablet Take 1 tablet (10 mg total) by mouth daily.   tadalafil (CIALIS) 20 MG tablet Take 20 mg by mouth daily as needed for erectile dysfunction.     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  Tobacco Use   Smoking status: Every Day    Packs/day: 1.00    Types: Cigarettes   Smokeless tobacco: Not on file  Substance and Sexual Activity   Alcohol use: No   Drug use: No   Sexual activity: Not on file  Other Topics Concern   Not on file  Social History Narrative   Right handed   Mining engineer    Social Determinants of Health   Financial Resource Strain: Not on file  Food Insecurity: Not on file  Transportation Needs: Not on file  Physical Activity: Not on file  Stress: Not on file  Social Connections: Not on file     Family History: The patient's family history includes Asthma in his mother; Epilepsy in his sister; Healthy in his child; Heart disease in his maternal grandfather. ROS:   Please see the history of present illness.    All 14 point review of systems negative except as described per history of present illness  EKGs/Labs/Other Studies Reviewed:      Recent Labs: 10/31/2021: BUN 6; Creatinine, Ser 1.03; Hemoglobin 14.3; Platelets 289; Potassium 4.3; Sodium 140 04/04/2022: TSH 4.29  Recent Lipid Panel No results found for: "CHOL", "TRIG", "HDL", "CHOLHDL", "VLDL", "LDLCALC", "LDLDIRECT"  Physical Exam:    VS:  BP 136/74 (BP Location: Left Arm, Patient Position: Sitting)    Pulse 94   Ht 6\' 1"  (1.854 m)   Wt 171 lb 9.6 oz (77.8 kg)   SpO2 99%   BMI 22.64 kg/m     Wt Readings from Last 3 Encounters:  04/16/22 171 lb 9.6 oz (77.8 kg)  04/04/22 172 lb 3.2 oz (78.1 kg)  12/21/21 168 lb (76.2 kg)     GEN:  Well nourished, well developed in no acute distress HEENT: Normal NECK: No JVD; No carotid bruits LYMPHATICS: No lymphadenopathy CARDIAC: RRR, no murmurs, no rubs, no gallops RESPIRATORY:  Clear to auscultation without rales, wheezing or rhonchi  ABDOMEN: Soft, non-tender, non-distended MUSCULOSKELETAL:  No edema; No deformity  SKIN: Warm  and dry LOWER EXTREMITIES: no swelling NEUROLOGIC:  Alert and oriented x 3 PSYCHIATRIC:  Normal affect   ASSESSMENT:    1. Angina pectoris (HCC)   2. Coronary artery disease involving native coronary artery of native heart without angina pectoris   3. EMPHYSEMA   4. Bipolar affective disorder in remission (HCC)   5. Dyslipidemia   6. TOBACCO ABUSE    PLAN:    In order of problems listed above:  Angina pectoris, start having symptoms again we will try medical therapy first however I see him very quickly in my office to see how she is responding to it.  I will start him on the ranolazine 5 mg twice daily.  I told him if this became more frequent lasting longer he need to let me know if it does not go away with nitroglycerin and rest he need to go to emergency room.  In the meantime we will continue antiplatelets therapy.  I will add Plavix 75 mg daily to his medical regimen Dyslipidemia he is taking Crestor 10 mg daily.  I do not have any fasting lipid profile we will do the test. Ongoing smoking.  He smokes 5 cigarettes a day told him that ultimately is completely discontinue hopefully he will be able to accomplish that Bipolar disorder.  He is getting ready to see his psychiatrist to change his medication is worried that he has been taking this medication for 15 years and something need to be  changed.   Medication Adjustments/Labs and Tests Ordered: Current medicines are reviewed at length with the patient today.  Concerns regarding medicines are outlined above.  No orders of the defined types were placed in this encounter.  Medication changes: No orders of the defined types were placed in this encounter.   Signed, Georgeanna Lea, MD, [facility] 04/16/2022 9:33 AM    [facility]

## 2022-05-08 ENCOUNTER — Ambulatory Visit: Payer: Managed Care, Other (non HMO) | Attending: Cardiology | Admitting: Cardiology

## 2022-05-08 ENCOUNTER — Encounter: Payer: Self-pay | Admitting: Cardiology

## 2022-05-08 VITALS — BP 134/78 | HR 50 | Ht 72.0 in | Wt 168.0 lb

## 2022-05-08 DIAGNOSIS — I25118 Atherosclerotic heart disease of native coronary artery with other forms of angina pectoris: Secondary | ICD-10-CM

## 2022-05-08 DIAGNOSIS — F17200 Nicotine dependence, unspecified, uncomplicated: Secondary | ICD-10-CM

## 2022-05-08 DIAGNOSIS — J438 Other emphysema: Secondary | ICD-10-CM | POA: Diagnosis not present

## 2022-05-08 DIAGNOSIS — I209 Angina pectoris, unspecified: Secondary | ICD-10-CM

## 2022-05-08 DIAGNOSIS — Z01812 Encounter for preprocedural laboratory examination: Secondary | ICD-10-CM

## 2022-05-08 DIAGNOSIS — E785 Hyperlipidemia, unspecified: Secondary | ICD-10-CM

## 2022-05-08 NOTE — Progress Notes (Signed)
Cardiology Office Note:    Date:  05/08/2022   ID:  Richard Sanford, DOB 18-Dec-1965, MRN [id number]  PCP:  Hadley Pen, MD  Cardiologist:  Gypsy Balsam, MD    Referring MD: Hadley Pen, MD   Chief Complaint  Patient presents with   Follow-up    History of Present Illness:    Richard Sanford is a 56 y.o. male with past medical history significant for coronary artery disease, cardiac catheterization done in Nov 01, 2021 showed 80% stenosis of mid LAD, FFR has been performed which was 0.84, he did have successful PTCA and stent with 3 over 12 mm drug-eluting stent with 0 residual stenosis.  Additional problem include essential hypertension, smoking, likely he cut down significantly amount of cigarettes he smokes only 5 cigarettes a day right now, bipolar disorder, COPD, dyslipidemia.  Initially after stent implantation he feels very good but then he show on follow-up visit and started having symptoms again I try to manage this with medication however unsuccessfully he is still having some episode of chest pain.  I put him on ranolazine which did not help much.  4 episode of chest pain he takes nitroglycerin with good relief.  Past Medical History:  Diagnosis Date   Angina    Asthma    Bipolar 1 disorder (HCC)    CAD (coronary artery disease)    CHRONIC AIRWAY OBSTRUCTION NEC 06/16/2008   Qualifier: Diagnosis of  By: Vernie Murders     COPD (chronic obstructive pulmonary disease) (HCC)    Depression    EMPHYSEMA 07/04/2010   Qualifier: Diagnosis of  By: Carver Fila     Esophageal reflux 06/16/2008   Qualifier: Diagnosis of  By: Vernie Murders     GERD (gastroesophageal reflux disease)    H/O: gout    HEMOPTYSIS UNSPECIFIED 07/04/2010   Qualifier: Diagnosis of  By: Shelle Iron MD, Maree Krabbe    Hypercholesterolemia    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   Qualifier: Diagnosis of  By: Vernie Murders     Mental disorder    bipolar   Rotator cuff tear, left     Past  Surgical History:  Procedure Laterality Date   CHOLECYSTECTOMY     CORONARY STENT INTERVENTION N/A 11/01/2021   Procedure: CORONARY STENT INTERVENTION;  Surgeon: Yvonne Kendall, MD;  [facility];  Service: Cardiovascular;  Laterality: N/A;   INTRAVASCULAR IMAGING/OCT N/A 11/01/2021   Procedure: INTRAVASCULAR IMAGING/OCT;  Surgeon: Yvonne Kendall, MD;  [facility];  Service: Cardiovascular;  Laterality: N/A;   INTRAVASCULAR PRESSURE WIRE/FFR STUDY N/A 11/01/2021   Procedure: INTRAVASCULAR PRESSURE WIRE/FFR STUDY;  Surgeon: Yvonne Kendall, MD;  [facility];  Service: Cardiovascular;  Laterality: N/A;   LEFT HEART CATH AND CORONARY ANGIOGRAPHY N/A 11/01/2021   Procedure: LEFT HEART CATH AND CORONARY ANGIOGRAPHY;  Surgeon: Yvonne Kendall, MD;  [facility];  Service: Cardiovascular;  Laterality: N/A;   SHOULDER OPEN ROTATOR CUFF REPAIR  09/26/2011   Procedure: ROTATOR CUFF REPAIR SHOULDER OPEN;  Surgeon: Drucilla Schmidt, MD;  Location: [facility];  Service: Orthopedics;  Laterality: Left;  Open Left Shoulder Anterior Acrominectomy and Rotator Cuff Repair   TOOTH EXTRACTION     VASECTOMY      Current Medications: Current Meds  Medication Sig   albuterol (VENTOLIN HFA) 108 (90 Base) MCG/ACT inhaler Inhale 2 puffs into the lungs every 6 (six) hours as needed for wheezing or shortness of breath.  aspirin EC 81 MG tablet Take 1 tablet (81 mg total) by mouth daily. Swallow whole.   clopidogrel (PLAVIX) 75 MG tablet Take 1 tablet (75 mg total) by mouth daily.   gabapentin (NEURONTIN) 300 MG capsule Take 600 mg by mouth 2 (two) times daily.   lamoTRIgine (LAMICTAL) 200 MG tablet Take 200 mg by mouth 2 (two) times daily.   lithium carbonate 300 MG capsule Take 600 mg by mouth 2 (two) times daily with a meal.   metoprolol tartrate (LOPRESSOR) 25 MG tablet Take 1 tablet (25 mg total) by mouth 2 (two) times daily.   nitroGLYCERIN  (NITROSTAT) 0.4 MG SL tablet Place 1 tablet (0.4 mg total) under the tongue every 5 (five) minutes as needed for chest pain. Do not use if you have taken Cialis within 48 hours.   pantoprazole (PROTONIX) 40 MG tablet Take 40 mg by mouth daily.   ranolazine (RANEXA) 500 MG 12 hr tablet Take 1 tablet (500 mg total) by mouth 2 (two) times daily.   rosuvastatin (CRESTOR) 10 MG tablet Take 1 tablet (10 mg total) by mouth daily.   sildenafil (VIAGRA) 50 MG tablet Take 50 mg by mouth as needed for erectile dysfunction.     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  Tobacco Use   Smoking status: Every Day    Packs/day: 1.00    Types: Cigarettes   Smokeless tobacco: Not on file  Substance and Sexual Activity   Alcohol use: No   Drug use: No   Sexual activity: Not on file  Other Topics Concern   Not on file  Social History Narrative   Right handed   Biomedical scientist    Social Determinants of Health   Financial Resource Strain: Not on file  Food Insecurity: Not on file  Transportation Needs: Not on file  Physical Activity: Not on file  Stress: Not on file  Social Connections: Not on file     Family History: The patient's family history includes Asthma in his mother; Epilepsy in his sister; Healthy in his child; Heart disease in his maternal grandfather. ROS:   Please see the history of present illness.    All 14 point review of systems negative except as described per history of present illness  EKGs/Labs/Other Studies Reviewed:      Recent Labs: 10/31/2021: BUN 6; Creatinine, Ser 1.03; Hemoglobin 14.3; Platelets 289; Potassium 4.3; Sodium 140 04/04/2022: TSH 4.29  Recent Lipid Panel No results found for: "CHOL", "TRIG",  "HDL", "CHOLHDL", "VLDL", "LDLCALC", "LDLDIRECT"  Physical Exam:    VS:  BP 134/78 (BP Location: Right Arm, Patient Position: Sitting, Cuff Size: Normal)   Pulse (!) 50   Ht 6' (1.829 m)   Wt 168 lb (76.2 kg)   SpO2 98%   BMI 22.78 kg/m     Wt Readings from Last 3 Encounters:  05/08/22 168 lb (76.2 kg)  04/16/22 171 lb 9.6 oz (77.8 kg)  04/04/22 172 lb 3.2 oz (78.1 kg)     GEN:  Well nourished, well developed in no acute distress HEENT: Normal NECK: No JVD; No carotid bruits LYMPHATICS: No lymphadenopathy CARDIAC: RRR, no murmurs, no rubs, no gallops RESPIRATORY:  Clear to auscultation without rales, wheezing or rhonchi  ABDOMEN: Soft, non-tender, non-distended MUSCULOSKELETAL:  No edema; No deformity  SKIN: Warm and dry LOWER EXTREMITIES: no swelling NEUROLOGIC:  Alert and oriented x 3 PSYCHIATRIC:  Normal affect   ASSESSMENT:    1. Coronary artery disease of native artery of native heart with stable angina pectoris (HCC)   2. Angina pectoris (HCC)   3. EMPHYSEMA   4. TOBACCO ABUSE   5. Dyslipidemia    PLAN:    In order of problems listed above:  Coronary artery disease status post PTCA and stenting of mid LAD done in May of this year.  He felt great initially but then started having symptoms again, I am worried about reactivation of the problem we will talk about potentially doing stress test he does not want to.  He prefers to have another cardiac catheterization to verify to make sure everything is fine.  In the meantime we will continue dual antiplatelet therapy he is on Plavix and aspirin, will continue with ranolazine as well as Crestor 10.  I will make arrangements for fasting lipid profile to be done.  Cardiac catheterization procedure 1 more time has been explained to him including all risk benefits as well as alternatives. Smoking, he used to smoke 2 packs/day now he cut down to only half pack which I congratulated him for encouraged him to work toward ultimate  goal of quitting smoking.   Medication Adjustments/Labs and Tests Ordered: Current medicines are reviewed at length with the patient today.  Concerns regarding medicines are outlined above.  No orders of the defined types were placed in this encounter.  Medication changes: No orders of the defined types were placed in this encounter.   Signed, Georgeanna Lea, MD, [facility] 05/08/2022 4:06 PM    [facility]

## 2022-05-08 NOTE — Addendum Note (Signed)
Addended by: Roosvelt Harps R on: 05/08/2022 04:31 PM   Modules accepted: Orders

## 2022-05-08 NOTE — Patient Instructions (Signed)
Medication Instructions:  Your physician recommends that you continue on your current medications as directed. Please refer to the Current Medication list given to you today.  *If you need a refill on your cardiac medications before your next appointment, please call your pharmacy*   Lab Work: Your physician recommends that you return for lab work in: Today for BMP & CBC  If you have labs (blood work) drawn today and your tests are completely normal, you will receive your results only by: MyChart Message (if you have MyChart) OR A paper copy in the mail If you have any lab test that is abnormal or we need to change your treatment, we will call you to review the results.   Testing/Procedures:  [facility] A DEPT OF [facility] [place] [facility] A DEPT OF [facility] [address] [place] [address] Dept: [phone] Loc: [phone]  Richard Sanford  05/08/2022  You are scheduled for a Cardiac Catheterization on Monday, November 27 with Dr. Cristal [place].  1. Please arrive at the [facility] (Main Entrance A) at [facility]: [address] [place], [place] [address] at 8:30 AM (This time is two hours before your procedure to ensure your preparation). Free valet parking service is available.   Special note: Every effort is made to have your procedure done on time. Please understand that emergencies sometimes delay scheduled procedures.  2. Diet: Do not eat solid foods after midnight.  The patient may have clear liquids until 5am upon the day of the procedure.    4. Medication instructions in preparation for your procedure:   Contrast Allergy: No   On the morning of your procedure, take your Aspirin 81 mg and any morning medicines NOT listed above.  You may use sips of water.  5. Plan for one night stay--bring personal belongings. 6. Bring a current list of your medications and current insurance  cards. 7. You MUST have a responsible person to drive you home. 8. Someone MUST be with you the first 24 hours after you arrive home or your discharge will be delayed. 9. Please wear clothes that are easy to get on and off and wear slip-on shoes.  Thank you for allowing [place] to care for you!   --  Invasive Cardiovascular services    Follow-Up: At [facility], you and your health needs are our priority.  As part of our continuing mission to provide you with exceptional heart care, we have created designated Provider Care Teams.  These Care Teams include your primary Cardiologist (physician) and Advanced Practice Providers (APPs -  Physician Assistants and Nurse Practitioners) who all work together to provide you with the care you need, when you need it.  We recommend signing up for the patient portal called "MyChart".  Sign up information is provided on this After Visit Summary.  MyChart is used to connect with patients for Virtual Visits (Telemedicine).  Patients are able to view lab/test results, encounter notes, upcoming appointments, etc.  Non-urgent messages can be sent to your provider as well.   To learn more about what you can do with MyChart, go to [email or web address].    Your next appointment:   2 month(s)  The format for your next appointment:   In Person  Provider:   Gypsy Balsam, MD    Other Instructions   Important Information About Sugar

## 2022-05-20 ENCOUNTER — Encounter (HOSPITAL_COMMUNITY): Admission: RE | Payer: Self-pay | Source: Ambulatory Visit

## 2022-05-20 ENCOUNTER — Ambulatory Visit (HOSPITAL_COMMUNITY)
Admission: RE | Admit: 2022-05-20 | Payer: Managed Care, Other (non HMO) | Source: Ambulatory Visit | Admitting: Internal Medicine

## 2022-05-20 SURGERY — LEFT HEART CATH AND CORONARY ANGIOGRAPHY
Anesthesia: LOCAL

## 2022-05-21 ENCOUNTER — Other Ambulatory Visit: Payer: Self-pay

## 2022-05-21 ENCOUNTER — Telehealth: Payer: Self-pay | Admitting: Neurology

## 2022-05-21 ENCOUNTER — Telehealth: Payer: Self-pay | Admitting: *Deleted

## 2022-05-21 DIAGNOSIS — R251 Tremor, unspecified: Secondary | ICD-10-CM

## 2022-05-21 NOTE — Telephone Encounter (Signed)
Left message for pt to call back about rescheduling his Cardiac Cath that had been scheduled for 05/20/22. Needs left heart cath [id number] for chest pain per Dr. Bing Matter.

## 2022-05-21 NOTE — Telephone Encounter (Signed)
Dat Scan has been sent off for approval today

## 2022-05-21 NOTE — Telephone Encounter (Signed)
Patient states that he saw Dr Tat on 04-04-22 and she wanted a scan done. He states that he saw that he refused the scan to be done but that is not true and would like to get the scan done at this time

## 2022-05-22 NOTE — Telephone Encounter (Signed)
Left another message for pt to call back about having his cardiac cath rescheduled.

## 2022-05-24 ENCOUNTER — Telehealth: Payer: Self-pay | Admitting: Neurology

## 2022-05-24 NOTE — Telephone Encounter (Signed)
Patient wife called and state that the patient has a scan on 12--12-12 and would like to know more about the scan the patient is getting nervous about getting the scan done. Please call

## 2022-05-24 NOTE — Telephone Encounter (Signed)
Called pt for the 3rd time today and left message to please call us back about re-scheduling his heart catheterization.

## 2022-05-24 NOTE — Telephone Encounter (Signed)
Called patients wife and answered questions to the best of my knowledge on the procedure involving the Dat Scan

## 2022-06-04 ENCOUNTER — Telehealth: Payer: Self-pay

## 2022-06-04 ENCOUNTER — Encounter (HOSPITAL_COMMUNITY)
Admission: RE | Admit: 2022-06-04 | Discharge: 2022-06-04 | Disposition: A | Discharge status: Home / Self Care | Payer: Managed Care, Other (non HMO) | Source: Ambulatory Visit | Attending: Neurology | Admitting: Neurology

## 2022-06-04 DIAGNOSIS — R251 Tremor, unspecified: Secondary | ICD-10-CM | POA: Diagnosis present

## 2022-06-04 MED ORDER — IOFLUPANE I 123 185 MBQ/2.5ML IV SOLN
4.5000 | Freq: Once | INTRAVENOUS | Status: AC | PRN
  Administered 2022-06-04: 4.5 via INTRAVENOUS

## 2022-06-04 MED ORDER — POTASSIUM IODIDE (ANTIDOTE) 130 MG PO TABS
ORAL_TABLET | ORAL | Status: AC
  Filled 2022-06-04: qty 1

## 2022-06-04 NOTE — Telephone Encounter (Signed)
LVM regarding rescheduling Cardiac Cath

## 2022-07-24 ENCOUNTER — Ambulatory Visit: Payer: Managed Care, Other (non HMO) | Attending: Cardiology | Admitting: Cardiology

## 2022-07-25 ENCOUNTER — Encounter: Payer: Self-pay | Admitting: Cardiology

## 2022-10-18 ENCOUNTER — Ambulatory Visit: Payer: Self-pay | Admitting: Orthopedic Surgery

## 2022-10-18 NOTE — H&P (Signed)
Richard Sanford is an 57 y.o. male.   Chief Complaint: right shoulder pain HPI: Visit For: Follow up (to discuss MRI) Location: right; shoulder Duration: 2 months Quality: aching; worsening Severity: moderate; pain level 8/10 Alleviating Factors: rest; limited weight bearing Aggravating Factors: lifting; ROM; weightbearing Prior Imaging: x ray; MRI  Past Medical History:  Diagnosis Date   Angina    Asthma    Bipolar 1 disorder (HCC)    CAD (coronary artery disease)    CHRONIC AIRWAY OBSTRUCTION NEC 06/16/2008   Qualifier: Diagnosis of  By: Vernie Murders     COPD (chronic obstructive pulmonary disease) (HCC)    Depression    EMPHYSEMA 07/04/2010   Qualifier: Diagnosis of  By: Carver Fila     Esophageal reflux 06/16/2008   Qualifier: Diagnosis of  By: Vernie Murders     GERD (gastroesophageal reflux disease)    H/O: gout    HEMOPTYSIS UNSPECIFIED 07/04/2010   Qualifier: Diagnosis of  By: Shelle Iron MD, Maree Krabbe    Hypercholesterolemia    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   Qualifier: Diagnosis of  By: Vernie Murders     Mental disorder    bipolar   Rotator cuff tear, left     Past Surgical History:  Procedure Laterality Date   CHOLECYSTECTOMY     CORONARY IMAGING/OCT N/A 11/01/2021   Procedure: INTRAVASCULAR IMAGING/OCT;  Surgeon: Yvonne Kendall, MD;  Location: [facility];  Service: Cardiovascular;  Laterality: N/A;   CORONARY PRESSURE/FFR STUDY N/A 11/01/2021   Procedure: INTRAVASCULAR PRESSURE WIRE/FFR STUDY;  Surgeon: Yvonne Kendall, MD;  Location: [facility];  Service: Cardiovascular;  Laterality: N/A;   CORONARY STENT INTERVENTION N/A 11/01/2021   Procedure: CORONARY STENT INTERVENTION;  Surgeon: Yvonne Kendall, MD;  Location: [facility];  Service: Cardiovascular;  Laterality: N/A;   LEFT HEART CATH AND CORONARY ANGIOGRAPHY N/A 11/01/2021   Procedure: LEFT HEART CATH AND CORONARY ANGIOGRAPHY;  Surgeon: Yvonne Kendall, MD;  Location: [facility]  INV[facility]IVE [facility];  Service: Cardiovascular;  Laterality: N/A;   SHOULDER OPEN ROTATOR CUFF REPAIR  09/26/2011   Procedure: ROTATOR CUFF REPAIR SHOULDER OPEN;  Surgeon: Drucilla Schmidt, MD;  Location: [facility];  Service: Orthopedics;  Laterality: Left;  Open Left Shoulder Anterior Acrominectomy and Rotator Cuff Repair   TOOTH EXTRACTION     VASECTOMY      Family History  Problem Relation Age of Onset   Asthma Mother    Epilepsy Sister    Heart disease Maternal Grandfather    Healthy Child    Social History:  reports that he has been smoking cigarettes. He has been smoking an average of 1 pack per day. He does not have any smokeless tobacco history on file. He reports that he does not drink alcohol and does not use drugs.  Allergies:  Allergies  Allergen Reactions   Penicillins Swelling and Rash   Nsaids Other (See Comments)    Ulcers    Tolmetin Other (See Comments)    It is an NSAID   Current medications: albuterol sulfate 2.5 mg/3 mL (0.083 %) solution for nebulization albuterol sulfate HFA 90 mcg/actuation aerosol inhaler Anoro Ellipta 62.5 mcg-25 mcg/actuation powder for inhalation clopidogreL 75 mg tablet gabapentin 300 mg capsule HYDROcodone 5 mg-acetaminophen 325 mg tablet lamoTRIgine 200 mg tablet levoFLOXacin 750 mg tablet lithium carbonate 300 mg capsule LORazepam 1 mg tablet methocarbamoL 500 mg tablet metoprolol tartrate 25 mg tablet nitroglycerin 0.4 mg sublingual tablet ondansetron 4 mg disintegrating tablet  pantoprazole 40 mg tablet,delayed release raNITIdine 150 mg tablet ranolazine ER 500 mg tablet,extended release,12 hr rosuvastatin 10 mg tablet sildenafiL 50 mg tablet sucralfate 100 mg/mL oral suspension sulfamethoxazole 800 mg-trimethoprim 160 mg tablet SUMAtriptan 50 mg tablet tadalafiL 20 mg tablet Trelegy Ellipta 100 mcg-62.5 mcg-25 mcg powder for inhalation  Review of Systems  Constitutional: Negative.   HENT: Negative.    Eyes: Negative.    Respiratory: Negative.    Cardiovascular: Negative.   Gastrointestinal: Negative.   Endocrine: Negative.   Genitourinary: Negative.   Musculoskeletal:  Positive for arthralgias, joint swelling and myalgias.  Skin: Negative.   Neurological: Negative.     There were no vitals taken for this visit. Physical Exam Constitutional:      Appearance: Normal appearance.  HENT:     Head: Normocephalic and atraumatic.     Right Ear: External ear normal.     Left Ear: External ear normal.     Nose: Nose normal.     Mouth/Throat:     Pharynx: Oropharynx is clear.  Eyes:     Conjunctiva/sclera: Conjunctivae normal.  Cardiovascular:     Rate and Rhythm: Normal rate and regular rhythm.     Pulses: Normal pulses.     Heart sounds: Normal heart sounds.  Pulmonary:     Effort: Pulmonary effort is normal.     Breath sounds: Normal breath sounds.  Abdominal:     General: Bowel sounds are normal.  Musculoskeletal:     Cervical back: Normal range of motion.     Comments: Constitutional: General Appearance: healthy-appearing and NAD.  Psychiatric: Mood and Affect: normal mood and affect.  Cardiovascular System: Arterial Pulses Right: radial normal and brachial normal. Varicosities Right: no varicosities.  C-Spine/Neck: Active Range of Motion: flexion normal, extension normal, and no pain elicited on motion.  Shoulders: Inspection Right: no misalignment, atrophy, erythema, swelling, or scapular winging. Bony Palpation Right: no tenderness of the sternoclavicular joint, the coracoid process, the acromioclavicular joint, the bicipital groove, or the scapula. Soft Tissue Palpation Right: tenderness of the supraspinatus and the subacromial bursa. Active Range of Motion Right: limited. Special Tests Right: Speed's test negative and Neer's test positive. Stability Right: no laxity, sulcus sign negative, and anterior apprehension test negative. Strength Right: abduction 5/5, adduction 5/5, flexion 5/5,  and extension 5/5.  Skin: Right Upper Extremity: normal.  Neurological System: Biceps Reflex Right: normal (2). Brachioradialis Reflex Right: normal (2). Triceps Reflex Right: normal (2). Sensation on the Right: C5 normal, C6 normal, and C7 normal.  Neurological:     Mental Status: He is alert.    MRI demonstrates full-thickness tear of the rotator cuff supraspinatus with retraction with a tear defect. Moderate fraying of the biceps tendon. Mild chondromalacia of the joint moderate AC arthrosis.  Assessment/Plan Impression:  1. Shoulder pain secondary to full-thickness retracted tear of the rotator cuff supraspinatus 2. Bicipital tendinosis  Plan:  We discussed options given the acuity and his level of disability we discussed the rotator cuff repair An extensive discussion concerning the pathology relevant anatomy and treatment options. After that discussion we mutually agreed to proceed with repair of the rotator cuff utilizing arthroscopic assistance if possible. The risks and benefits of that procedure were discussed including bleeding, infection, suboptimal range of motion, deep venous thrombosis, pulmonary embolism, anesthetic complications etc. in addition we discussed the postoperative course to include approximately 4 weeks of passive range of motion followed by 4 weeks of active range of motion followed by 4-12 weeks of  progressive strengthening exercises. In addition we discussed protective activities to reduce the risk of a reinjury including impingement activities with elbow above the shoulder as well as reaching and repetitive circular motion activities. The hospital stay will either be as a outpatient with a regional block versus overnight depending upon the extent of the procedure and any challenging health issues with a first postoperative visit 2 weeks following the surgery.  He also probably acquired a patch graft.  In addition he may require tenotomy or tenodesis of the  biceps tendon.  He is unable to sleep right now we discussed hydrocodone prior to that. Preoperative clearance  A prescription for an opioid was given to be taken as directed for pain control. I discussed the risks including cognitive changes which may affect the ability to operate machinery and to drive. In addition the side effect of constipation as well as to avoid taking with conflicting medications that were discussed. The patient's prescription drug monitoring report was reviewed with no red flags noted.  Oxycodone, Kefzol. No history of DVT or MRSA. Patient does have a history of bipolar disease on lithium.  Patient currently smokes 5 cigarettes/day is committed to tobacco cessation indicating the deleterious side effects upon smoking and surgery recovery infection healing excetra  Plan right shoulder scope, SAD, debridement, mini-open RCR, possible patch graft, biceps tenotomy vs tenodesis  Dorothy Spark, PA-C for Dr Shelle Iron 10/18/2022, 10:42 AM

## 2022-10-21 NOTE — Patient Instructions (Addendum)
SURGICAL WAITING ROOM VISITATION  Patients having surgery or a procedure may have no more than 2 support people in the waiting area - these visitors may rotate.    Children under the age of 42 must have an adult with them who is not the patient.  Due to an increase in RSV and influenza rates and associated hospitalizations, children ages 20 and under may not visit patients in [facility] hospitals.  If the patient needs to stay at the hospital during part of their recovery, the visitor guidelines for inpatient rooms apply. Pre-op nurse will coordinate an appropriate time for 1 support person to accompany patient in pre-op.  This support person may not rotate.    Please refer to the [facility] website for the visitor guidelines for Inpatients (after your surgery is over and you are in a regular room).       Your procedure is scheduled on: 10-30-22   Report to [facility] Main Entrance    Report to admitting at 9:20 AM   Call this number if you have problems the morning of surgery [phone]   Do not eat food or drink liquids :After Midnight.           If you have questions, please contact your surgeon's office.   FOLLOW ANY ADDITIONAL PRE OP INSTRUCTIONS YOU RECEIVED FROM YOUR SURGEON'S OFFICE!!!     Oral Hygiene is also important to reduce your risk of infection.                                    Remember - BRUSH YOUR TEETH THE MORNING OF SURGERY WITH YOUR REGULAR TOOTHPASTE  DENTURES WILL BE REMOVED PRIOR TO SURGERY PLEASE DO NOT APPLY "Poly grip" OR ADHESIVES!!!   Do NOT smoke after Midnight   Take these medicines the morning of surgery with A SIP OF WATER:   Gabapentin  Hydrocodone  Lamotrigine  Lithium  Pantoprazole  Okay to use inhalers                              You may not have any metal on your body including  jewelry, and body piercing             Do not wear lotions, powders, cologne, or deodorant              Men may shave face and  neck.   Do not bring valuables to the hospital. [facility] IS NOT RESPONSIBLE   FOR VALUABLES.   Contacts, glasses, dentures or bridgework may not be worn into surgery.  DO NOT BRING YOUR HOME MEDICATIONS TO THE HOSPITAL. PHARMACY WILL DISPENSE MEDICATIONS LISTED ON YOUR MEDICATION LIST TO YOU DURING YOUR ADMISSION IN THE HOSPITAL!    Patients discharged on the day of surgery will not be allowed to drive home.  Someone NEEDS to stay with you for the first 24 hours after anesthesia.              Please read over the following fact sheets you were given: IF YOU HAVE QUESTIONS ABOUT YOUR PRE-OP INSTRUCTIONS PLEASE CALL [phone] [facility]   If you received a COVID test during your pre-op visit  it is requested that you wear a mask when out in public, stay away from anyone that may not be feeling well and notify your surgeon if you develop  symptoms. If you test positive for Covid or have been in contact with anyone that has tested positive in the last 10 days please notify you surgeon.  [facility] - Preparing for Surgery Before surgery, you can play an important role.  Because skin is not sterile, your skin needs to be as free of germs as possible.  You can reduce the number of germs on your skin by washing with CHG (chlorahexidine gluconate) soap before surgery.  CHG is an antiseptic cleaner which kills germs and bonds with the skin to continue killing germs even after washing. Please DO NOT use if you have an allergy to CHG or antibacterial soaps.  If your skin becomes reddened/irritated stop using the CHG and inform your nurse when you arrive at Short Stay. Do not shave (including legs and underarms) for at least 48 hours prior to the first CHG shower.  You may shave your face/neck.  Please follow these instructions carefully:  1.  Shower with CHG Soap the night before surgery and the  morning of surgery.  2.  If you choose to wash your hair, wash your hair first as usual with your normal   shampoo.  3.  After you shampoo, rinse your hair and body thoroughly to remove the shampoo.                             4.  Use CHG as you would any other liquid soap.  You can apply chg directly to the skin and wash.  Gently with a scrungie or clean washcloth.  5.  Apply the CHG Soap to your body ONLY FROM THE NECK DOWN.   Do   not use on face/ open                           Wound or open sores. Avoid contact with eyes, ears mouth and   genitals (private parts).                       Wash face,  Genitals (private parts) with your normal soap.             6.  Wash thoroughly, paying special attention to the area where your    surgery  will be performed.  7.  Thoroughly rinse your body with warm water from the neck down.  8.  DO NOT shower/wash with your normal soap after using and rinsing off the CHG Soap.                9.  Pat yourself dry with a clean towel.            10.  Wear clean pajamas.            11.  Place clean sheets on your bed the night of your first shower and do not  sleep with pets. Day of Surgery : Do not apply any lotions/deodorants the morning of surgery.  Please wear clean clothes to the hospital/surgery center.  FAILURE TO FOLLOW THESE INSTRUCTIONS MAY RESULT IN THE CANCELLATION OF YOUR SURGERY  PATIENT SIGNATURE_________________________________  NURSE SIGNATURE__________________________________  ________________________________________________________________________    Rogelia Mire   An incentive spirometer is a tool that can help keep your lungs clear and active. This tool measures how well you are filling your lungs with each breath. Taking long deep breaths may help reverse or decrease  the chance of developing breathing (pulmonary) problems (especially infection) following: A long period of time when you are unable to move or be active. BEFORE THE PROCEDURE  If the spirometer includes an indicator to show your best effort, your nurse or respiratory  therapist will set it to a desired goal. If possible, sit up straight or lean slightly forward. Try not to slouch. Hold the incentive spirometer in an upright position. INSTRUCTIONS FOR USE  Sit on the edge of your bed if possible, or sit up as far as you can in bed or on a chair. Hold the incentive spirometer in an upright position. Breathe out normally. Place the mouthpiece in your mouth and seal your lips tightly around it. Breathe in slowly and as deeply as possible, raising the piston or the ball toward the top of the column. Hold your breath for 3-5 seconds or for as long as possible. Allow the piston or ball to fall to the bottom of the column. Remove the mouthpiece from your mouth and breathe out normally. Rest for a few seconds and repeat Steps 1 through 7 at least 10 times every 1-2 hours when you are awake. Take your time and take a few normal breaths between deep breaths. The spirometer may include an indicator to show your best effort. Use the indicator as a goal to work toward during each repetition. After each set of 10 deep breaths, practice coughing to be sure your lungs are clear. If you have an incision (the cut made at the time of surgery), support your incision when coughing by placing a pillow or rolled up towels firmly against it. Once you are able to get out of bed, walk around indoors and cough well. You may stop using the incentive spirometer when instructed by your caregiver.  RISKS AND COMPLICATIONS Take your time so you do not get dizzy or light-headed. If you are in pain, you may need to take or ask for pain medication before doing incentive spirometry. It is harder to take a deep breath if you are having pain. AFTER USE Rest and breathe slowly and easily. It can be helpful to keep track of a log of your progress. Your caregiver can provide you with a simple table to help with this. If you are using the spirometer at home, follow these instructions: SEEK MEDICAL  CARE IF:  You are having difficultly using the spirometer. You have trouble using the spirometer as often as instructed. Your pain medication is not giving enough relief while using the spirometer. You develop fever of 100.5 F (38.1 C) or higher. SEEK IMMEDIATE MEDICAL CARE IF:  You cough up bloody sputum that had not been present before. You develop fever of 102 F (38.9 C) or greater. You develop worsening pain at or near the incision site. MAKE SURE YOU:  Understand these instructions. Will watch your condition. Will get help right away if you are not doing well or get worse. Document Released: 10/21/2006 Document Revised: 09/02/2011 Document Reviewed: 12/22/2006 [facility] Patient Information 2014 [place], [place].

## 2022-10-21 NOTE — Progress Notes (Addendum)
COVID Vaccine Completed:  Date of COVID positive in last 90 days:  PCP - Keturah Barre, MD Cardiologist - Gypsy Balsam, MD Neurologist - Kerin Salen, DO  Chest x-ray -  EKG - 05-08-22 Epic Stress Test - 09-24-21 Epic ECHO -  Cardiac Cath - 11-01-21 Epic Pacemaker/ICD device last checked: Spinal Cord Stimulator:  Bowel Prep -   Sleep Study -  CPAP -   Fasting Blood Sugar -  Checks Blood Sugar _____ times a day  Last dose of GLP1 agonist-  N/A GLP1 instructions:  N/A   Last dose of SGLT-2 inhibitors-  N/A SGLT-2 instructions: N/A   Blood Thinner Instructions:  Plavix Aspirin Instructions:  ASA 81 Last Dose:  Activity level:  Can go up a flight of stairs and perform activities of daily living without stopping and without symptoms of chest pain or shortness of breath.  Able to exercise without symptoms  Unable to go up a flight of stairs without symptoms of     Anesthesia review:  CAD with PTCA, empysema, COPD  Patient denies shortness of breath, fever, cough and chest pain at PAT appointment  Patient verbalized understanding of instructions that were given to them at the PAT appointment. Patient was also instructed that they will need to review over the PAT instructions again at home before surgery.

## 2022-10-24 ENCOUNTER — Other Ambulatory Visit: Payer: Self-pay

## 2022-10-24 ENCOUNTER — Encounter (HOSPITAL_COMMUNITY)
Admission: RE | Admit: 2022-10-24 | Discharge: 2022-10-24 | Disposition: A | Discharge status: Home / Self Care | Payer: Managed Care, Other (non HMO) | Source: Ambulatory Visit | Attending: Specialist | Admitting: Specialist

## 2022-10-24 ENCOUNTER — Encounter (HOSPITAL_COMMUNITY): Payer: Self-pay

## 2022-10-24 VITALS — BP 157/76 | HR 62 | Temp 98.1°F | Resp 16 | Ht 73.0 in | Wt 161.0 lb

## 2022-10-24 DIAGNOSIS — M75101 Unspecified rotator cuff tear or rupture of right shoulder, not specified as traumatic: Secondary | ICD-10-CM | POA: Insufficient documentation

## 2022-10-24 DIAGNOSIS — Z01812 Encounter for preprocedural laboratory examination: Secondary | ICD-10-CM | POA: Diagnosis present

## 2022-10-24 DIAGNOSIS — J439 Emphysema, unspecified: Secondary | ICD-10-CM | POA: Diagnosis not present

## 2022-10-24 DIAGNOSIS — I2510 Atherosclerotic heart disease of native coronary artery without angina pectoris: Secondary | ICD-10-CM

## 2022-10-24 DIAGNOSIS — Z7902 Long term (current) use of antithrombotics/antiplatelets: Secondary | ICD-10-CM | POA: Insufficient documentation

## 2022-10-24 DIAGNOSIS — Z79899 Other long term (current) drug therapy: Secondary | ICD-10-CM | POA: Diagnosis not present

## 2022-10-24 DIAGNOSIS — Z7982 Long term (current) use of aspirin: Secondary | ICD-10-CM | POA: Diagnosis not present

## 2022-10-24 DIAGNOSIS — K219 Gastro-esophageal reflux disease without esophagitis: Secondary | ICD-10-CM | POA: Insufficient documentation

## 2022-10-24 DIAGNOSIS — F17210 Nicotine dependence, cigarettes, uncomplicated: Secondary | ICD-10-CM | POA: Insufficient documentation

## 2022-10-24 HISTORY — DX: Dyspnea, unspecified: R06.00

## 2022-10-24 HISTORY — DX: Personal history of urinary calculi: Z87.442

## 2022-10-24 HISTORY — DX: Unspecified osteoarthritis, unspecified site: M19.90

## 2022-10-24 HISTORY — DX: Headache, unspecified: R51.9

## 2022-10-24 HISTORY — DX: Pneumonia, unspecified organism: J18.9

## 2022-10-24 NOTE — Progress Notes (Signed)
   10/24/22 0915  OBSTRUCTIVE SLEEP APNEA  Have you ever been diagnosed with sleep apnea through a sleep study? No  Do you snore loudly (loud enough to be heard through closed doors)?  1  Do you often feel tired, fatigued, or sleepy during the daytime (such as falling asleep during driving or talking to someone)? 1  Has anyone observed you stop breathing during your sleep? 1  Do you have, or are you being treated for high blood pressure? 1  BMI more than 35 kg/m2? 0  Age > 50 (1-yes) 1  Neck circumference greater than:Male 16 inches or larger, Male 17inches or larger? 0  Male Gender (Yes=1) 1  Obstructive Sleep Apnea Score 6  Score 5 or greater  Results sent to PCP

## 2022-10-25 ENCOUNTER — Telehealth: Payer: Self-pay | Admitting: *Deleted

## 2022-10-25 ENCOUNTER — Encounter (HOSPITAL_COMMUNITY): Payer: Self-pay | Admitting: Physician Assistant

## 2022-10-25 NOTE — Telephone Encounter (Signed)
   Name: Richard Sanford  DOB: 1965-09-14  MRN: [id number]  Primary Cardiologist: Gypsy Balsam, MD   Preoperative team, please contact this patient and set up a phone call appointment for further preoperative risk assessment. Please obtain consent and complete medication review. Thank you for your help.  I confirm that guidance regarding antiplatelet and oral anticoagulation therapy has been completed and, if necessary, noted below.  His Plavix may be held for 5 days prior to his procedure.  Please resume as soon as hemostasis is achieved.  His aspirin will need to be continued throughout the perioperative procedure.   [name], NP 10/25/2022, 3:05 PM [facility]

## 2022-10-25 NOTE — Telephone Encounter (Signed)
   Pre-operative Risk Assessment    Patient Name: Richard Sanford  DOB: [birth year] MRN: [id number]      Request for Surgical Clearance    Procedure:  RIGHT SHOULDER ARTHROSCOPY, DEBRIDEMENT, SUBACROMIAL DECOMPRESSION, MINI OPEN ROTATOR CUFF REPAIR, POSSIBLE PATCH GRAFT, BICEPS TENOTOMY VS TENODESIS (Right)   Date of Surgery:  Clearance 10/30/22                                 Surgeon:  DR. Jene Every Surgeon's Group or Practice Name:  [name] Phone number:  [phone] ATTN: Rosalva Ferron Fax number:  [phone]   Type of Clearance Requested:   - Medical  - Pharmacy:  Hold Aspirin and Clopidogrel (Plavix)     Type of Anesthesia:  General    Additional requests/questions:    [name]   10/25/2022, 2:43 PM

## 2022-10-25 NOTE — Telephone Encounter (Signed)
1st attempt: I left a message for the patient to call our office back to schedule a tele visit for pre-op. 

## 2022-10-25 NOTE — Progress Notes (Signed)
Anesthesia chart review   Case: [id number] Date/Time: 10/30/22 1121   Procedure: RIGHT SHOULDER ARTHROSCOPY, DEBRIDEMENT, SUBACROMIAL DECOMPRESSION, MINI OPEN ROTATOR CUFF REPAIR, POSSIBLE PATCH GRAFT, BICEPS TENOTOMY VS TENODESIS (Right)   Anesthesia type: General   Pre-op diagnosis: Right shoulder rotator cuff tear   Location: [facility]OM 10 / [facility]   Surgeons: Jene Every, MD       DISCUSSION: 57 year old smoker with history of GERD, COPD, CAD s/p drug-eluting stent 11/01/2021,, right shoulder rotator cuff tear scheduled for above procedure 10/30/2022 with Dr. Jene Every.  Patient last seen by cardiology 05/08/2022.  Per office visit note, "Coronary artery disease status post PTCA and stenting of mid LAD done in May of this year. He felt great initially but then started having symptoms again, I am worried about reactivation of the problem we will talk about potentially doing stress test he does not want to. He prefers to have another cardiac catheterization to verify to make sure everything is fine. In the meantime we will continue dual antiplatelet therapy he is on Plavix and aspirin, will continue with ranolazine as well as Crestor 10. I will make arrangements for fasting lipid profile to be done. Cardiac catheterization procedure 1 more time has been explained to him including all risk benefits as well as alternatives."  Pt missed his follow up appointment with cardiology in January.   Cardiac clearance requested, VM left with Rosalva Ferron 10/25/2022.  VS: BP (!) 157/76   Pulse 62   Temp 36.7 C (Oral)   Resp 16   Ht 6\' 1"  (1.854 m)   Wt 73 kg   SpO2 100%   BMI 21.24 kg/m   PROVIDERS: Hadley Pen, MD is PCP  Cardiologist - Gypsy Balsam, MD  LABS:  labs to be done DOS, lab error in PAT (all labs ordered are listed, but only abnormal results are displayed)  Labs Reviewed - No data to display   IMAGES:   EKG:   CV:   Past Medical History:  Diagnosis Date    Angina    Arthritis    Asthma    Bipolar 1 disorder (HCC)    CAD (coronary artery disease)    CHRONIC AIRWAY OBSTRUCTION NEC 06/16/2008   Qualifier: Diagnosis of  By: Vernie Murders     COPD (chronic obstructive pulmonary disease) (HCC)    Depression    Dyspnea    EMPHYSEMA 07/04/2010   Qualifier: Diagnosis of  By: Carver Fila     Esophageal reflux 06/16/2008   Qualifier: Diagnosis of  By: Vernie Murders     GERD (gastroesophageal reflux disease)    H/O: gout    Headache    HEMOPTYSIS UNSPECIFIED 07/04/2010   Qualifier: Diagnosis of  By: Shelle Iron MD, Maree Krabbe    History of kidney stones    Hypercholesterolemia    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   Qualifier: Diagnosis of  By: Vernie Murders     Mental disorder    bipolar   Pneumonia    Rotator cuff tear, left     Past Surgical History:  Procedure Laterality Date   CHOLECYSTECTOMY     CORONARY IMAGING/OCT N/A 11/01/2021   Procedure: INTRAVASCULAR IMAGING/OCT;  Surgeon: Yvonne Kendall, MD;  [facility];  Service: Cardiovascular;  Laterality: N/A;   CORONARY PRESSURE/FFR STUDY N/A 11/01/2021   Procedure: INTRAVASCULAR PRESSURE WIRE/FFR STUDY;  Surgeon: Yvonne Kendall, MD;  [facility];  Service: Cardiovascular;  Laterality: N/A;   CORONARY STENT  INTERVENTION N/A 11/01/2021   Procedure: CORONARY STENT INTERVENTION;  Surgeon: Yvonne Kendall, MD;  [facility];  Service: Cardiovascular;  Laterality: N/A;   LEFT HEART CATH AND CORONARY ANGIOGRAPHY N/A 11/01/2021   Procedure: LEFT HEART CATH AND CORONARY ANGIOGRAPHY;  Surgeon: Yvonne Kendall, MD;  [facility];  Service: Cardiovascular;  Laterality: N/A;   SHOULDER OPEN ROTATOR CUFF REPAIR  09/26/2011   Procedure: ROTATOR CUFF REPAIR SHOULDER OPEN;  Surgeon: Drucilla Schmidt, MD;  Location: [facility];  Service: Orthopedics;  Laterality: Left;  Open Left Shoulder Anterior Acrominectomy and Rotator Cuff Repair    TOOTH EXTRACTION     VASECTOMY      MEDICATIONS:  albuterol (VENTOLIN HFA) 108 (90 Base) MCG/ACT inhaler   aspirin EC 81 MG tablet   clopidogrel (PLAVIX) 75 MG tablet   gabapentin (NEURONTIN) 300 MG capsule   HYDROcodone-acetaminophen (NORCO/VICODIN) 5-325 MG tablet   lamoTRIgine (LAMICTAL) 200 MG tablet   lithium carbonate 300 MG capsule   metoprolol tartrate (LOPRESSOR) 25 MG tablet   nitroGLYCERIN (NITROSTAT) 0.4 MG SL tablet   pantoprazole (PROTONIX) 40 MG tablet   ranolazine (RANEXA) 500 MG 12 hr tablet   rosuvastatin (CRESTOR) 10 MG tablet   sildenafil (VIAGRA) 50 MG tablet   No current facility-administered medications for this encounter.     Jodell Cipro Ward, PA-C [facility] [phone]

## 2022-10-29 NOTE — Telephone Encounter (Signed)
Left message x 2 for the pt to call back to schedule a tele pre op appt.

## 2022-10-30 ENCOUNTER — Encounter (HOSPITAL_COMMUNITY): Admission: RE | Payer: Self-pay | Source: Ambulatory Visit

## 2022-10-30 ENCOUNTER — Telehealth: Payer: Self-pay | Admitting: *Deleted

## 2022-10-30 ENCOUNTER — Ambulatory Visit (HOSPITAL_COMMUNITY)
Admission: RE | Admit: 2022-10-30 | Payer: Managed Care, Other (non HMO) | Source: Ambulatory Visit | Admitting: Specialist

## 2022-10-30 DIAGNOSIS — I2510 Atherosclerotic heart disease of native coronary artery without angina pectoris: Secondary | ICD-10-CM

## 2022-10-30 SURGERY — SHOULDER ARTHROSCOPY WITH ROTATOR CUFF REPAIR AND SUBACROMIAL DECOMPRESSION
Anesthesia: General | Laterality: Right

## 2022-10-30 NOTE — Telephone Encounter (Signed)
S/w pt is scheduled for telephone clearance.     Patient Consent for Virtual Visit         MESHILEM SHURN has provided verbal consent on 10/30/2022 for a virtual visit (video or telephone).   CONSENT FOR VIRTUAL VISIT FOR:  [name]  By participating in this virtual visit I agree to the following:  I hereby voluntarily request, consent and authorize [facility] and its employed or contracted physicians, physician assistants, nurse practitioners or other licensed health care professionals (the Practitioner), to provide me with telemedicine health care services (the "Services") as deemed necessary by the treating Practitioner. I acknowledge and consent to receive the Services by the Practitioner via telemedicine. I understand that the telemedicine visit will involve communicating with the Practitioner through live audiovisual communication technology and the disclosure of certain medical information by electronic transmission. I acknowledge that I have been given the opportunity to request an in-person assessment or other available alternative prior to the telemedicine visit and am voluntarily participating in the telemedicine visit.  I understand that I have the right to withhold or withdraw my consent to the use of telemedicine in the course of my care at any time, without affecting my right to future care or treatment, and that the Practitioner or I may terminate the telemedicine visit at any time. I understand that I have the right to inspect all information obtained and/or recorded in the course of the telemedicine visit and may receive copies of available information for a reasonable fee.  I understand that some of the potential risks of receiving the Services via telemedicine include:  Delay or interruption in medical evaluation due to technological equipment failure or disruption; Information transmitted may not be sufficient (e.g. poor resolution of images) to allow for  appropriate medical decision making by the Practitioner; and/or  In rare instances, security protocols could fail, causing a breach of personal health information.  Furthermore, I acknowledge that it is my responsibility to provide information about my medical history, conditions and care that is complete and accurate to the best of my ability. I acknowledge that Practitioner's advice, recommendations, and/or decision may be based on factors not within their control, such as incomplete or inaccurate data provided by me or distortions of diagnostic images or specimens that may result from electronic transmissions. I understand that the practice of medicine is not an exact science and that Practitioner makes no warranties or guarantees regarding treatment outcomes. I acknowledge that a copy of this consent can be made available to me via my patient portal [facility] MyChart), or I can request a printed copy by calling the office of [facility].    I understand that my insurance will be billed for this visit.   I have read or had this consent read to me. I understand the contents of this consent, which adequately explains the benefits and risks of the Services being provided via telemedicine.  I have been provided ample opportunity to ask questions regarding this consent and the Services and have had my questions answered to my satisfaction. I give my informed consent for the services to be provided through the use of telemedicine in my medical care

## 2022-10-30 NOTE — Telephone Encounter (Signed)
Patient is calling to follow-up on the status of his clearance.  Patient stated he has been in pain for a while and wants to get his surgery scheduled.

## 2022-10-30 NOTE — Telephone Encounter (Signed)
Received a secure chat from Richard Sanford,scheduler, pt's son needed to speak to someone urgently, call pt back and lvm.

## 2022-10-30 NOTE — Progress Notes (Signed)
Admitting called back to short stay because patient showed up for surgery this morning and wanted to know why his procedure had been cancelled.    From the notes that have been documented, there was a request for cardiac clearance and that clearance was not completed.  It appears that the cardiology office made multiple attempts to make that telephone visit with patient to get the clearance completed.    Patient stated to the admitting staff that he never received a telephone call from the cardiology office.    Patients family asked if they could get the clearance could the patient still have his procedure today.  I called to speak with Dr Shelle Iron who was in the OR to discuss.  Dr Shelle Iron stated that he could not complete the procedure today even if the cardiology clearance was obtained today.   Patient and family made aware via the Admitting staff.

## 2022-10-30 NOTE — Telephone Encounter (Signed)
Patient's son was returning phone call for tele pre-op visit. Says that it was an urgent call

## 2022-11-05 ENCOUNTER — Ambulatory Visit: Payer: Managed Care, Other (non HMO) | Attending: Cardiology

## 2022-11-05 DIAGNOSIS — Z01810 Encounter for preprocedural cardiovascular examination: Secondary | ICD-10-CM
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
## 2022-11-06 ENCOUNTER — Ambulatory Visit: Payer: Self-pay | Admitting: Orthopedic Surgery

## 2022-11-13 NOTE — Progress Notes (Signed)
Sent message, via epic in basket, requesting orders in epic from surgeon.  

## 2022-11-20 NOTE — Patient Instructions (Signed)
SURGICAL WAITING ROOM VISITATION  Patients having surgery or a procedure may have no more than 2 support people in the waiting area - these visitors may rotate.    Children under the age of 6 must have an adult with them who is not the patient.  Due to an increase in RSV and influenza rates and associated hospitalizations, children ages 67 and under may not visit patients in [facility] hospitals.  If the patient needs to stay at the hospital during part of their recovery, the visitor guidelines for inpatient rooms apply. Pre-op nurse will coordinate an appropriate time for 1 support person to accompany patient in pre-op.  This support person may not rotate.    Please refer to the [facility] website for the visitor guidelines for Inpatients (after your surgery is over and you are in a regular room).    Your procedure is scheduled on: 12/06/22   Report to [facility] Main Entrance    Report to admitting at 10:15 AM   Call this number if you have problems the morning of surgery [phone]   Do not eat food :After Midnight.   After Midnight you may have the following liquids until 9:30 AM DAY OF SURGERY  Water Non-Citrus Juices (without pulp, NO RED-Apple, White grape, White cranberry) Black Coffee (NO MILK/CREAM OR CREAMERS, sugar ok)  Clear Tea (NO MILK/CREAM OR CREAMERS, sugar ok) regular and decaf                             Plain Jell-O (NO RED)                                           Fruit ices (not with fruit pulp, NO RED)                                     Popsicles (NO RED)                                                               Sports drinks like Gatorade (NO RED)              Drink 2 Ensure/G2 drinks AT 10:00 PM the night before surgery.        The day of surgery:  Drink ONE (1) Pre-Surgery Clear Ensure or G2 at AM the morning of surgery. Drink in one sitting. Do not sip.  This drink was given to you during your hospital  pre-op appointment  visit. Nothing else to drink after completing the  Pre-Surgery Clear Ensure or G2.          If you have questions, please contact your surgeon's office.   FOLLOW BOWEL PREP AND ANY ADDITIONAL PRE OP INSTRUCTIONS YOU RECEIVED FROM YOUR SURGEON'S OFFICE!!!     Oral Hygiene is also important to reduce your risk of infection.  Remember - BRUSH YOUR TEETH THE MORNING OF SURGERY WITH YOUR REGULAR TOOTHPASTE  DENTURES WILL BE REMOVED PRIOR TO SURGERY PLEASE DO NOT APPLY "Poly grip" OR ADHESIVES!!!   Do NOT smoke after Midnight   Take these medicines the morning of surgery with A SIP OF WATER: Inhaler, Gabapentin, Norco, Lamotrigine, Metoprolol, Pantoprazole, Rosuvastatin   These are anesthesia recommendations for holding your anticoagulants.  Please contact your prescribing physician to confirm IF it is safe to hold your anticoagulants for this length of time.   Eliquis Apixaban   72 hours   Xarelto Rivaroxaban   72 hours  Plavix Clopidogrel   120 hours  Pletal Cilostazol   120 hours    DO NOT TAKE ANY ORAL DIABETIC MEDICATIONS DAY OF YOUR SURGERY  Bring CPAP mask and tubing day of surgery.                              You may not have any metal on your body including jewelry, and body piercing             Do not wear lotions, powders, cologne, or deodorant              Men may shave face and neck.   Do not bring valuables to the hospital. [facility] IS NOT             RESPONSIBLE   FOR VALUABLES.   Contacts, glasses, dentures or bridgework may not be worn into surgery.  DO NOT BRING YOUR HOME MEDICATIONS TO THE HOSPITAL. PHARMACY WILL DISPENSE MEDICATIONS LISTED ON YOUR MEDICATION LIST TO YOU DURING YOUR ADMISSION IN THE HOSPITAL!    Patients discharged on the day of surgery will not be allowed to drive home.  Someone NEEDS to stay with you for the first 24 hours after anesthesia.   Special Instructions: Bring a copy of your healthcare  power of attorney and living will documents the day of surgery if you haven't scanned them before.              Please read over the following fact sheets you were given: IF YOU HAVE QUESTIONS ABOUT YOUR PRE-OP INSTRUCTIONS PLEASE CALL [phone] [place]   If you received a COVID test during your pre-op visit  it is requested that you wear a mask when out in public, stay away from anyone that may not be feeling well and notify your surgeon if you develop symptoms. If you test positive for Covid or have been in contact with anyone that has tested positive in the last 10 days please notify you surgeon.    [place] - Preparing for Surgery Before surgery, you can play an important role.  Because skin is not sterile, your skin needs to be as free of germs as possible.  You can reduce the number of germs on your skin by washing with CHG (chlorahexidine gluconate) soap before surgery.  CHG is an antiseptic cleaner which kills germs and bonds with the skin to continue killing germs even after washing. Please DO NOT use if you have an allergy to CHG or antibacterial soaps.  If your skin becomes reddened/irritated stop using the CHG and inform your nurse when you arrive at Short Stay. Do not shave (including legs and underarms) for at least 48 hours prior to the first CHG shower.  You may shave your face/neck.  Please follow these instructions carefully:  1.  Shower with  CHG Soap the night before surgery and the  morning of surgery.  2.  If you choose to wash your hair, wash your hair first as usual with your normal  shampoo.  3.  After you shampoo, rinse your hair and body thoroughly to remove the shampoo.                             4.  Use CHG as you would any other liquid soap.  You can apply chg directly to the skin and wash.  Gently with a scrungie or clean washcloth.  5.  Apply the CHG Soap to your body ONLY FROM THE NECK DOWN.   Do   not use on face/ open                           Wound or open  sores. Avoid contact with eyes, ears mouth and   genitals (private parts).                       Wash face,  Genitals (private parts) with your normal soap.             6.  Wash thoroughly, paying special attention to the area where your    surgery  will be performed.  7.  Thoroughly rinse your body with warm water from the neck down.  8.  DO NOT shower/wash with your normal soap after using and rinsing off the CHG Soap.                9.  Pat yourself dry with a clean towel.            10.  Wear clean pajamas.            11.  Place clean sheets on your bed the night of your first shower and do not  sleep with pets. Day of Surgery : Do not apply any lotions/deodorants the morning of surgery.  Please wear clean clothes to the hospital/surgery center.  FAILURE TO FOLLOW THESE INSTRUCTIONS MAY RESULT IN THE CANCELLATION OF YOUR SURGERY  PATIENT SIGNATURE_________________________________  NURSE SIGNATURE__________________________________  ________________________________________________________________________

## 2022-11-20 NOTE — Progress Notes (Signed)
Please place orders for PAT appointment scheduled 11/21/22.

## 2022-11-20 NOTE — Progress Notes (Signed)
COVID Vaccine Completed:  Date of COVID positive in last 90 days:  PCP - Keturah Barre, MD Cardiologist - Gypsy Balsam, MD  Cardiac clearance by 11/05/22 in Epic  Chest x-ray -  EKG - 05/08/22 Epic Stress Test - 09/24/21 Epic ECHO -  Cardiac Cath - 11/01/21 Epic Pacemaker/ICD device last checked: Spinal Cord Stimulator:  Bowel Prep -   Sleep Study -  CPAP -   Fasting Blood Sugar -  Checks Blood Sugar _____ times a day  Last dose of GLP1 agonist-  N/A GLP1 instructions:  N/A   Last dose of SGLT-2 inhibitors-  N/A SGLT-2 instructions: N/A   Blood Thinner Instructions:  Plavix, hold 5 days Aspirin Instructions: ASA 81, remain on Last Dose:  Activity level:  Can go up a flight of stairs and perform activities of daily living without stopping and without symptoms of chest pain or shortness of breath.  Able to exercise without symptoms  Unable to go up a flight of stairs without symptoms of     Anesthesia review:   Patient denies shortness of breath, fever, cough and chest pain at PAT appointment  Patient verbalized understanding of instructions that were given to them at the PAT appointment. Patient was also instructed that they will need to review over the PAT instructions again at home before surgery.

## 2022-11-21 ENCOUNTER — Encounter (HOSPITAL_COMMUNITY)
Admission: RE | Admit: 2022-11-21 | Discharge: 2022-11-21 | Disposition: A | Discharge status: Home / Self Care | Payer: Managed Care, Other (non HMO) | Source: Ambulatory Visit | Attending: Specialist | Admitting: Specialist

## 2022-11-21 ENCOUNTER — Other Ambulatory Visit: Payer: Self-pay

## 2022-11-21 ENCOUNTER — Encounter (HOSPITAL_COMMUNITY): Payer: Self-pay

## 2022-11-21 ENCOUNTER — Ambulatory Visit: Payer: Self-pay | Admitting: Orthopedic Surgery

## 2022-11-21 VITALS — BP 133/84 | HR 72 | Temp 98.7°F | Resp 16 | Ht 73.0 in | Wt 162.0 lb

## 2022-11-21 DIAGNOSIS — I25118 Atherosclerotic heart disease of native coronary artery with other forms of angina pectoris: Secondary | ICD-10-CM | POA: Insufficient documentation

## 2022-11-21 DIAGNOSIS — Z01812 Encounter for preprocedural laboratory examination: Secondary | ICD-10-CM | POA: Diagnosis present

## 2022-11-21 LAB — CBC
HCT: 45.3 % (ref 39.0–52.0)
Hemoglobin: 14.3 g/dL (ref 13.0–17.0)
MCH: 32.4 pg (ref 26.0–34.0)
MCHC: 31.6 g/dL (ref 30.0–36.0)
MCV: 102.5 fL — ABNORMAL HIGH (ref 80.0–100.0)
Platelets: 222 10*3/uL (ref 150–400)
RBC: 4.42 MIL/uL (ref 4.22–5.81)
RDW: 12.3 % (ref 11.5–15.5)
WBC: 8.4 10*3/uL (ref 4.0–10.5)
nRBC: 0 % (ref 0.0–0.2)

## 2022-11-21 LAB — BASIC METABOLIC PANEL
Anion gap: 7 (ref 5–15)
BUN: 6 mg/dL (ref 6–20)
CO2: 24 mmol/L (ref 22–32)
Calcium: 9.5 mg/dL (ref 8.9–10.3)
Chloride: 107 mmol/L (ref 98–111)
Creatinine, Ser: 1.02 mg/dL (ref 0.61–1.24)
GFR, Estimated: >60 mL/min (ref >60)
Glucose, Bld: 85 mg/dL (ref 70–99)
Potassium: 3.7 mmol/L (ref 3.5–5.1)
Sodium: 138 mmol/L (ref 135–145)

## 2022-11-21 NOTE — H&P (View-Only) (Signed)
Richard Sanford is an 57 y.o. male.   Chief Complaint: right shoulder pain HPI: Visit For: Follow up (to discuss MRI) Location: right; shoulder Duration: 5-6 months Quality: aching; worsening Severity: moderate; pain level 8/10 Alleviating Factors: rest; limited weight bearing Aggravating Factors: lifting; ROM; weightbearing Prior Imaging: x ray; MRI       Past Medical History:  Diagnosis Date   Angina     Asthma     Bipolar 1 disorder (HCC)     CAD (coronary artery disease)     CHRONIC AIRWAY OBSTRUCTION NEC 06/16/2008    Qualifier: Diagnosis of  By: Raskin, Leslie     COPD (chronic obstructive pulmonary disease) (HCC)     Depression     EMPHYSEMA 07/04/2010    Qualifier: Diagnosis of  By: Silva, Mindy     Esophageal reflux 06/16/2008    Qualifier: Diagnosis of  By: Raskin, Leslie     GERD (gastroesophageal reflux disease)     H/O: gout     HEMOPTYSIS UNSPECIFIED 07/04/2010    Qualifier: Diagnosis of  By: Clance MD, Keith M    Hypercholesterolemia     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    Qualifier: Diagnosis of  By: Raskin, Leslie     Mental disorder      bipolar   Rotator cuff tear, left             Past Surgical History:  Procedure Laterality Date   CHOLECYSTECTOMY       CORONARY IMAGING/OCT N/A 11/01/2021    Procedure: INTRAVASCULAR IMAGING/OCT;  Surgeon: End, Christopher, MD;  Location: [facility] INVASIVE [facility];  Service: Cardiovascular;  Laterality: N/A;   CORONARY PRESSURE/FFR STUDY N/A 11/01/2021    Procedure: INTRAVASCULAR PRESSURE WIRE/FFR STUDY;  Surgeon: End, Christopher, MD;  Location: [facility] INVASIVE CV LAB;  Service: Cardiovascular;  Laterality: N/A;   CORONARY STENT INTERVENTION N/A 11/01/2021    Procedure: CORONARY STENT INTERVENTION;  Surgeon: End, Christopher, MD;  Location: [facility] INVAS[facility] CV LAB;  Service: Cardiovascular;  Laterality: N/A;   LEFT HEART CATH AND CORONARY ANGIOGRAPHY N/A 11/01/2021    Procedure: LEFT HEART CATH AND CORONARY ANGIOGRAPHY;   Surgeon: End, Christopher, MD;  Location: [facility] INVAS[facility] CV LAB;  Service: Cardiovascular;  Laterality: N/A;   SHOULDER OPEN ROTATOR CUFF REPAIR   09/26/2011    Procedure: ROTATOR CUFF REPAIR SHOULDER OPEN;  Surgeon: James P Aplington, MD;  Location: [facility];  Service: Orthopedics;  Laterality: Left;  Open Left Shoulder Anterior Acrominectomy and Rotator Cuff Repair   TOOTH EXTRACTION       VASECTOMY               Family History  Problem Relation Age of Onset   Asthma Mother     Epilepsy Sister     Heart disease Maternal Grandfather     Healthy Child      Social History:  reports that he has been smoking cigarettes. He has been smoking an average of 1 pack per day. He does not have any smokeless tobacco history on file. He reports that he does not drink alcohol and does not use drugs.   Allergies:       Allergies  Allergen Reactions   Penicillins Swelling and Rash   Nsaids Other (See Comments)      Ulcers     Tolmetin Other (See Comments)      It is an NSAID    Current medications: albuterol sulfate 2.5 mg/3 mL (0.083 %) solution for nebulization   albuterol sulfate HFA 90 mcg/actuation aerosol inhaler Anoro Ellipta 62.5 mcg-25 mcg/actuation powder for inhalation clopidogreL 75 mg tablet gabapentin 300 mg capsule HYDROcodone 5 mg-acetaminophen 325 mg tablet lamoTRIgine 200 mg tablet levoFLOXacin 750 mg tablet lithium carbonate 300 mg capsule LORazepam 1 mg tablet methocarbamoL 500 mg tablet metoprolol tartrate 25 mg tablet nitroglycerin 0.4 mg sublingual tablet ondansetron 4 mg disintegrating tablet pantoprazole 40 mg tablet,delayed release raNITIdine 150 mg tablet ranolazine ER 500 mg tablet,extended release,12 hr rosuvastatin 10 mg tablet sildenafiL 50 mg tablet sucralfate 100 mg/mL oral suspension sulfamethoxazole 800 mg-trimethoprim 160 mg tablet SUMAtriptan 50 mg tablet tadalafiL 20 mg tablet Trelegy Ellipta 100 mcg-62.5 mcg-25 mcg powder for inhalation   Review of  Systems  Constitutional: Negative.   HENT: Negative.    Eyes: Negative.   Respiratory: Negative.    Cardiovascular: Negative.   Gastrointestinal: Negative.   Endocrine: Negative.   Genitourinary: Negative.   Musculoskeletal:  Positive for arthralgias, joint swelling and myalgias.  Skin: Negative.   Neurological: Negative.       There were no vitals taken for this visit. Physical Exam Constitutional:      Appearance: Normal appearance.  HENT:     Head: Normocephalic and atraumatic.     Right Ear: External ear normal.     Left Ear: External ear normal.     Nose: Nose normal.     Mouth/Throat:     Pharynx: Oropharynx is clear.  Eyes:     Conjunctiva/sclera: Conjunctivae normal.  Cardiovascular:     Rate and Rhythm: Normal rate and regular rhythm.     Pulses: Normal pulses.     Heart sounds: Normal heart sounds.  Pulmonary:     Effort: Pulmonary effort is normal.     Breath sounds: Normal breath sounds.  Abdominal:     General: Bowel sounds are normal.  Musculoskeletal:     Cervical back: Normal range of motion.     Comments: Constitutional: General Appearance: healthy-appearing and NAD.   Psychiatric: Mood and Affect: normal mood and affect.   Cardiovascular System: Arterial Pulses Right: radial normal and brachial normal. Varicosities Right: no varicosities.   C-Spine/Neck: Active Range of Motion: flexion normal, extension normal, and no pain elicited on motion.   Shoulders: Inspection Right: no misalignment, atrophy, erythema, swelling, or scapular winging. Bony Palpation Right: no tenderness of the sternoclavicular joint, the coracoid process, the acromioclavicular joint, the bicipital groove, or the scapula. Soft Tissue Palpation Right: tenderness of the supraspinatus and the subacromial bursa. Active Range of Motion Right: limited. Special Tests Right: Speed's test negative and Neer's test positive. Stability Right: no laxity, sulcus sign negative, and anterior  apprehension test negative. Strength Right: abduction 5/5, adduction 5/5, flexion 5/5, and extension 5/5.   Skin: Right Upper Extremity: normal.   Neurological System: Biceps Reflex Right: normal (2). Brachioradialis Reflex Right: normal (2). Triceps Reflex Right: normal (2). Sensation on the Right: C5 normal, C6 normal, and C7 normal.  Neurological:     Mental Status: He is alert.     MRI demonstrates full-thickness tear of the rotator cuff supraspinatus with retraction with a tear defect. Moderate fraying of the biceps tendon. Mild chondromalacia of the joint moderate AC arthrosis.   Assessment/Plan Impression:   1. Shoulder pain secondary to full-thickness retracted tear of the rotator cuff supraspinatus 2. Bicipital tendinosis   Plan:   We discussed options given the acuity and his level of disability we discussed the rotator cuff repair An extensive discussion concerning the pathology   relevant anatomy and treatment options. After that discussion we mutually agreed to proceed with repair of the rotator cuff utilizing arthroscopic assistance if possible. The risks and benefits of that procedure were discussed including bleeding, infection, suboptimal range of motion, deep venous thrombosis, pulmonary embolism, anesthetic complications etc. in addition we discussed the postoperative course to include approximately 4 weeks of passive range of motion followed by 4 weeks of active range of motion followed by 4-12 weeks of progressive strengthening exercises. In addition we discussed protective activities to reduce the risk of a reinjury including impingement activities with elbow above the shoulder as well as reaching and repetitive circular motion activities. The hospital stay will either be as a outpatient with a regional block versus overnight depending upon the extent of the procedure and any challenging health issues with a first postoperative visit 2 weeks following the surgery.   He also  probably acquired a patch graft.   In addition he may require tenotomy or tenodesis of the biceps tendon.   He is unable to sleep right now we discussed hydrocodone prior to that. Preoperative clearance   A prescription for an opioid was given to be taken as directed for pain control. I discussed the risks including cognitive changes which may affect the ability to operate machinery and to drive. In addition the side effect of constipation as well as to avoid taking with conflicting medications that were discussed. The patient's prescription drug monitoring report was reviewed with no red flags noted.   Oxycodone, Kefzol. No history of DVT or MRSA. Patient does have a history of bipolar disease on lithium.   Patient currently smokes 5 cigarettes/day is committed to tobacco cessation indicating the deleterious side effects upon smoking and surgery recovery infection healing excetra   Plan right shoulder scope, SAD, debridement, mini-open RCR, possible patch graft, biceps tenotomy vs tenodesis   Josephmichael Lisenbee M Lira Stephen, PA-C for Dr Beane 11/21/22 11:54 AM    

## 2022-11-21 NOTE — H&P (Signed)
Richard Sanford is an 57 y.o. male.   Chief Complaint: right shoulder pain HPI: Visit For: Follow up (to discuss MRI) Location: right; shoulder Duration: 5-6 months Quality: aching; worsening Severity: moderate; pain level 8/10 Alleviating Factors: rest; limited weight bearing Aggravating Factors: lifting; ROM; weightbearing Prior Imaging: x ray; MRI       Past Medical History:  Diagnosis Date   Angina     Asthma     Bipolar 1 disorder (HCC)     CAD (coronary artery disease)     CHRONIC AIRWAY OBSTRUCTION NEC 06/16/2008    Qualifier: Diagnosis of  By: Vernie Murders     COPD (chronic obstructive pulmonary disease) (HCC)     Depression     EMPHYSEMA 07/04/2010    Qualifier: Diagnosis of  By: Carver Fila     Esophageal reflux 06/16/2008    Qualifier: Diagnosis of  By: Vernie Murders     GERD (gastroesophageal reflux disease)     H/O: gout     HEMOPTYSIS UNSPECIFIED 07/04/2010    Qualifier: Diagnosis of  By: Shelle Iron MD, Maree Krabbe    Hypercholesterolemia     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    Qualifier: Diagnosis of  By: Vernie Murders     Mental disorder      bipolar   Rotator cuff tear, left             Past Surgical History:  Procedure Laterality Date   CHOLECYSTECTOMY       CORONARY IMAGING/OCT N/A 11/01/2021    Procedure: INTRAVASCULAR IMAGING/OCT;  Surgeon: Yvonne Kendall, MD;  [facility];  Service: Cardiovascular;  Laterality: N/A;   CORONARY PRESSURE/FFR STUDY N/A 11/01/2021    Procedure: INTRAVASCULAR PRESSURE WIRE/FFR STUDY;  Surgeon: Yvonne Kendall, MD;  [facility];  Service: Cardiovascular;  Laterality: N/A;   CORONARY STENT INTERVENTION N/A 11/01/2021    Procedure: CORONARY STENT INTERVENTION;  Surgeon: Yvonne Kendall, MD;  [facility];  Service: Cardiovascular;  Laterality: N/A;   LEFT HEART CATH AND CORONARY ANGIOGRAPHY N/A 11/01/2021    Procedure: LEFT HEART CATH AND CORONARY ANGIOGRAPHY;   Surgeon: Yvonne Kendall, MD;  [facility];  Service: Cardiovascular;  Laterality: N/A;   SHOULDER OPEN ROTATOR CUFF REPAIR   09/26/2011    Procedure: ROTATOR CUFF REPAIR SHOULDER OPEN;  Surgeon: Drucilla Schmidt, MD;  Location: [facility];  Service: Orthopedics;  Laterality: Left;  Open Left Shoulder Anterior Acrominectomy and Rotator Cuff Repair   TOOTH EXTRACTION       VASECTOMY               Family History  Problem Relation Age of Onset   Asthma Mother     Epilepsy Sister     Heart disease Maternal Grandfather     Healthy Child      Social History:  reports that he has been smoking cigarettes. He has been smoking an average of 1 pack per day. He does not have any smokeless tobacco history on file. He reports that he does not drink alcohol and does not use drugs.   Allergies:       Allergies  Allergen Reactions   Penicillins Swelling and Rash   Nsaids Other (See Comments)      Ulcers     Tolmetin Other (See Comments)      It is an NSAID    Current medications: albuterol sulfate 2.5 mg/3 mL (0.083 %) solution for nebulization  albuterol sulfate HFA 90 mcg/actuation aerosol inhaler Anoro Ellipta 62.5 mcg-25 mcg/actuation powder for inhalation clopidogreL 75 mg tablet gabapentin 300 mg capsule HYDROcodone 5 mg-acetaminophen 325 mg tablet lamoTRIgine 200 mg tablet levoFLOXacin 750 mg tablet lithium carbonate 300 mg capsule LORazepam 1 mg tablet methocarbamoL 500 mg tablet metoprolol tartrate 25 mg tablet nitroglycerin 0.4 mg sublingual tablet ondansetron 4 mg disintegrating tablet pantoprazole 40 mg tablet,delayed release raNITIdine 150 mg tablet ranolazine ER 500 mg tablet,extended release,12 hr rosuvastatin 10 mg tablet sildenafiL 50 mg tablet sucralfate 100 mg/mL oral suspension sulfamethoxazole 800 mg-trimethoprim 160 mg tablet SUMAtriptan 50 mg tablet tadalafiL 20 mg tablet Trelegy Ellipta 100 mcg-62.5 mcg-25 mcg powder for inhalation   Review of  Systems  Constitutional: Negative.   HENT: Negative.    Eyes: Negative.   Respiratory: Negative.    Cardiovascular: Negative.   Gastrointestinal: Negative.   Endocrine: Negative.   Genitourinary: Negative.   Musculoskeletal:  Positive for arthralgias, joint swelling and myalgias.  Skin: Negative.   Neurological: Negative.       There were no vitals taken for this visit. Physical Exam Constitutional:      Appearance: Normal appearance.  HENT:     Head: Normocephalic and atraumatic.     Right Ear: External ear normal.     Left Ear: External ear normal.     Nose: Nose normal.     Mouth/Throat:     Pharynx: Oropharynx is clear.  Eyes:     Conjunctiva/sclera: Conjunctivae normal.  Cardiovascular:     Rate and Rhythm: Normal rate and regular rhythm.     Pulses: Normal pulses.     Heart sounds: Normal heart sounds.  Pulmonary:     Effort: Pulmonary effort is normal.     Breath sounds: Normal breath sounds.  Abdominal:     General: Bowel sounds are normal.  Musculoskeletal:     Cervical back: Normal range of motion.     Comments: Constitutional: General Appearance: healthy-appearing and NAD.   Psychiatric: Mood and Affect: normal mood and affect.   Cardiovascular System: Arterial Pulses Right: radial normal and brachial normal. Varicosities Right: no varicosities.   C-Spine/Neck: Active Range of Motion: flexion normal, extension normal, and no pain elicited on motion.   Shoulders: Inspection Right: no misalignment, atrophy, erythema, swelling, or scapular winging. Bony Palpation Right: no tenderness of the sternoclavicular joint, the coracoid process, the acromioclavicular joint, the bicipital groove, or the scapula. Soft Tissue Palpation Right: tenderness of the supraspinatus and the subacromial bursa. Active Range of Motion Right: limited. Special Tests Right: Speed's test negative and Neer's test positive. Stability Right: no laxity, sulcus sign negative, and anterior  apprehension test negative. Strength Right: abduction 5/5, adduction 5/5, flexion 5/5, and extension 5/5.   Skin: Right Upper Extremity: normal.   Neurological System: Biceps Reflex Right: normal (2). Brachioradialis Reflex Right: normal (2). Triceps Reflex Right: normal (2). Sensation on the Right: C5 normal, C6 normal, and C7 normal.  Neurological:     Mental Status: He is alert.     MRI demonstrates full-thickness tear of the rotator cuff supraspinatus with retraction with a tear defect. Moderate fraying of the biceps tendon. Mild chondromalacia of the joint moderate AC arthrosis.   Assessment/Plan Impression:   1. Shoulder pain secondary to full-thickness retracted tear of the rotator cuff supraspinatus 2. Bicipital tendinosis   Plan:   We discussed options given the acuity and his level of disability we discussed the rotator cuff repair An extensive discussion concerning the pathology  relevant anatomy and treatment options. After that discussion we mutually agreed to proceed with repair of the rotator cuff utilizing arthroscopic assistance if possible. The risks and benefits of that procedure were discussed including bleeding, infection, suboptimal range of motion, deep venous thrombosis, pulmonary embolism, anesthetic complications etc. in addition we discussed the postoperative course to include approximately 4 weeks of passive range of motion followed by 4 weeks of active range of motion followed by 4-12 weeks of progressive strengthening exercises. In addition we discussed protective activities to reduce the risk of a reinjury including impingement activities with elbow above the shoulder as well as reaching and repetitive circular motion activities. The hospital stay will either be as a outpatient with a regional block versus overnight depending upon the extent of the procedure and any challenging health issues with a first postoperative visit 2 weeks following the surgery.   He also  probably acquired a patch graft.   In addition he may require tenotomy or tenodesis of the biceps tendon.   He is unable to sleep right now we discussed hydrocodone prior to that. Preoperative clearance   A prescription for an opioid was given to be taken as directed for pain control. I discussed the risks including cognitive changes which may affect the ability to operate machinery and to drive. In addition the side effect of constipation as well as to avoid taking with conflicting medications that were discussed. The patient's prescription drug monitoring report was reviewed with no red flags noted.   Oxycodone, Kefzol. No history of DVT or MRSA. Patient does have a history of bipolar disease on lithium.   Patient currently smokes 5 cigarettes/day is committed to tobacco cessation indicating the deleterious side effects upon smoking and surgery recovery infection healing excetra   Plan right shoulder scope, SAD, debridement, mini-open RCR, possible patch graft, biceps tenotomy vs tenodesis   Dorothy Spark, PA-C for Dr Shelle Iron 11/21/22 11:54 AM

## 2022-11-27 NOTE — Progress Notes (Signed)
Anesthesia Chart Review   Case: [id number] Date/Time: 12/06/22 1215   Procedure: RIGHT SHOULDER ARTHROSCOPY, DEBRIDEMENT, SUBACROMIAL DECOMPRESSION, MINI OPEN ROTATOR CUFF REPAIR, POSSIBLE PATCH GRAFT, BICEPS TENOTOMY VS TENODESIS (Right)   Anesthesia type: General   Pre-op diagnosis: Right shoulder rotator cuff tear   [facility]   Surgeons: Jene Every, MD       DISCUSSION:57 y.o. smoker with h/o bipolar 1 disorder, GERD, COPD, asthma, CAD stent 11/01/2021, right shoulder rotator cuff tear scheduled for above procedure 12/06/2022 with Dr. Jene Every.   Pt last seen by cardiology 5/1/42024. Per OV note, "Mr. Bailon's perioperative risk of a major cardiac event is 0.9% according to the Revised Cardiac Risk Index (RCRI).  Therefore, he is at low risk for perioperative complications.   His functional capacity is good at 5.62 METs according to the Duke Activity Status Index (DASI). Recommendations: According to ACC/AHA guidelines, no further cardiovascular testing needed.  The patient may proceed to surgery at acceptable risk.   Antiplatelet and/or Anticoagulation Recommendations: The patient should remain on Aspirin without interruption.   Clopidogrel (Plavix) can be held for 5 days prior to his surgery and resumed as soon as possible post op."  Pt seen by PCP 11/25/2022 for 3-4 days of cough, diagnosed with COPD exacerbation treated with azithromycin, prednisone. Chest xray negative for pneumonia.    Pt will need to be two weeks symptom free from recent COPD exacerbation before proceeding with shoulder surgery. Discussed with Dr. Ermelinda Das office.  VS: There were no vitals taken for this visit.  PROVIDERS: Hadley Pen, MD is PCP   Cardiologist - Gypsy Balsam, MD   LABS: Labs reviewed: Acceptable for surgery. (all labs ordered are listed, but only abnormal results are displayed)  Labs Reviewed - No data to display   IMAGES:   EKG:   CV:  Past  Medical History:  Diagnosis Date   Angina    Arthritis    Asthma    Bipolar 1 disorder (HCC)    CAD (coronary artery disease)    CHRONIC AIRWAY OBSTRUCTION NEC 06/16/2008   Qualifier: Diagnosis of  By: Vernie Murders     COPD (chronic obstructive pulmonary disease) (HCC)    Depression    Dyspnea    EMPHYSEMA 07/04/2010   Qualifier: Diagnosis of  By: Carver Fila     Esophageal reflux 06/16/2008   Qualifier: Diagnosis of  By: Vernie Murders     GERD (gastroesophageal reflux disease)    H/O: gout    Headache    HEMOPTYSIS UNSPECIFIED 07/04/2010   Qualifier: Diagnosis of  By: Shelle Iron MD, Maree Krabbe    History of kidney stones    Hypercholesterolemia    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   Qualifier: Diagnosis of  By: Vernie Murders     Mental disorder    bipolar   Pneumonia    Rotator cuff tear, left     Past Surgical History:  Procedure Laterality Date   CHOLECYSTECTOMY     CORONARY IMAGING/OCT N/A 11/01/2021   Procedure: INTRAVASCULAR IMAGING/OCT;  Surgeon: Yvonne Kendall, MD;  [facility];  Service: Cardiovascular;  Laterality: N/A;   CORONARY PRESSURE/FFR STUDY N/A 11/01/2021   Procedure: INTRAVASCULAR PRESSURE WIRE/FFR STUDY;  Surgeon: Yvonne Kendall, MD;  [facility];  Service: Cardiovascular;  Laterality: N/A;   CORONARY STENT INTERVENTION N/A 11/01/2021   Procedure: CORONARY STENT INTERVENTION;  Surgeon: Yvonne Kendall, MD;  [facility];  Service: Cardiovascular;  Laterality: N/A;   LEFT HEART CATH AND CORONARY ANGIOGRAPHY N/A 11/01/2021   Procedure: LEFT HEART CATH AND CORONARY ANGIOGRAPHY;  Surgeon: Yvonne Kendall, MD;  [facility];  Service: Cardiovascular;  Laterality: N/A;   SHOULDER OPEN ROTATOR CUFF REPAIR  09/26/2011   Procedure: ROTATOR CUFF REPAIR SHOULDER OPEN;  Surgeon: Drucilla Schmidt, MD;  Location: [facility];  Service: Orthopedics;  Laterality: Left;  Open Left Shoulder Anterior  Acrominectomy and Rotator Cuff Repair   TOOTH EXTRACTION     VASECTOMY      MEDICATIONS: No current facility-administered medications for this encounter.    albuterol (VENTOLIN HFA) 108 (90 Base) MCG/ACT inhaler   aspirin EC 81 MG tablet   Aspirin-Acetaminophen-Caffeine (GOODY HEADACHE PO)   gabapentin (NEURONTIN) 300 MG capsule   lamoTRIgine (LAMICTAL) 200 MG tablet   lithium carbonate 300 MG capsule   pantoprazole (PROTONIX) 40 MG tablet   PRESCRIPTION MEDICATION   VITAMIN D PO   VITAMIN E PO   clopidogrel (PLAVIX) 75 MG tablet   HYDROcodone-acetaminophen (NORCO/VICODIN) 5-325 MG tablet   metoprolol tartrate (LOPRESSOR) 25 MG tablet   nitroGLYCERIN (NITROSTAT) 0.4 MG SL tablet   ranolazine (RANEXA) 500 MG 12 hr tablet   rosuvastatin (CRESTOR) 10 MG tablet   sildenafil (VIAGRA) 50 MG tablet    [facility] Ward, PA-C [facility] Pre-Surgical Testing [phone]

## 2022-12-04 ENCOUNTER — Ambulatory Visit: Payer: Self-pay | Admitting: Orthopedic Surgery

## 2022-12-06 NOTE — Patient Instructions (Signed)
SURGICAL WAITING ROOM VISITATION  Patients having surgery or a procedure may have no more than 2 support people in the waiting area - these visitors may rotate.    Children under the age of 29 must have an adult with them who is not the patient.  Due to an increase in RSV and influenza rates and associated hospitalizations, children ages 37 and under may not visit patients in [facility] hospitals.  If the patient needs to stay at the hospital during part of their recovery, the visitor guidelines for inpatient rooms apply. Pre-op nurse will coordinate an appropriate time for 1 support person to accompany patient in pre-op.  This support person may not rotate.    Please refer to the [facility] website for the visitor guidelines for Inpatients (after your surgery is over and you are in a regular room).    Your procedure is scheduled on: 12/20/22   Report to [facility] Main Entrance    Report to admitting at 7:35 AM   Call this number if you have problems the morning of surgery [phone]   Do not eat food :After Midnight.   After Midnight you may have the following liquids until 6:50 AM DAY OF SURGERY  Water Non-Citrus Juices (without pulp, NO RED-Apple, White grape, White cranberry) Black Coffee (NO MILK/CREAM OR CREAMERS, sugar ok)  Clear Tea (NO MILK/CREAM OR CREAMERS, sugar ok) regular and decaf                             Plain Jell-O (NO RED)                                           Fruit ices (not with fruit pulp, NO RED)                                     Popsicles (NO RED)                                                               Sports drinks like Gatorade (NO RED)     The day of surgery:  Drink ONE (1) Pre-Surgery Clear Ensure or G2 at AM the morning of surgery. Drink in one sitting. Do not sip.  This drink was given to you during your hospital  pre-op appointment visit. Nothing else to drink after completing the  Pre-Surgery Clear Ensure or G2.           If you have questions, please contact your surgeon's office.   FOLLOW BOWEL PREP AND ANY ADDITIONAL PRE OP INSTRUCTIONS YOU RECEIVED FROM YOUR SURGEON'S OFFICE!!!     Oral Hygiene is also important to reduce your risk of infection.                                    Remember - BRUSH YOUR TEETH THE MORNING OF SURGERY WITH YOUR REGULAR TOOTHPASTE  DENTURES WILL BE REMOVED PRIOR TO SURGERY PLEASE DO NOT APPLY "  Poly grip" OR ADHESIVES!!!   Do NOT smoke after Midnight   Take these medicines the morning of surgery with A SIP OF WATER: Inhalers, Gabapentin, Lamotrigine, Lithium, Pantoprazole  DO NOT TAKE ANY ORAL DIABETIC MEDICATIONS DAY OF YOUR SURGERY  Bring CPAP mask and tubing day of surgery.                              You may not have any metal on your body including jewelry, and body piercing             Do not wear lotions, powders, cologne, or deodorant              Men may shave face and neck.   Do not bring valuables to the hospital. [facility] IS NOT             RESPONSIBLE   FOR VALUABLES.   Contacts, glasses, dentures or bridgework may not be worn into surgery.  DO NOT BRING YOUR HOME MEDICATIONS TO THE HOSPITAL. PHARMACY WILL DISPENSE MEDICATIONS LISTED ON YOUR MEDICATION LIST TO YOU DURING YOUR ADMISSION IN THE HOSPITAL!    Patients discharged on the day of surgery will not be allowed to drive home.  Someone NEEDS to stay with you for the first 24 hours after anesthesia.   Special Instructions: Bring a copy of your healthcare power of attorney and living will documents the day of surgery if you haven't scanned them before.              Please read over the following fact sheets you were given: IF YOU HAVE QUESTIONS ABOUT YOUR PRE-OP INSTRUCTIONS PLEASE CALL [phone] Sanford   If you received a COVID test during your pre-op visit  it is requested that you wear a mask when out in public, stay away from anyone that may not be feeling well and notify your  surgeon if you develop symptoms. If you test positive for Covid or have been in contact with anyone that has tested positive in the last 10 days please notify you surgeon.    [facility] - Preparing for Surgery Before surgery, you can play an important role.  Because skin is not sterile, your skin needs to be as free of germs as possible.  You can reduce the number of germs on your skin by washing with CHG (chlorahexidine gluconate) soap before surgery.  CHG is an antiseptic cleaner which kills germs and bonds with the skin to continue killing germs even after washing. Please DO NOT use if you have an allergy to CHG or antibacterial soaps.  If your skin becomes reddened/irritated stop using the CHG and inform your nurse when you arrive at Short Stay. Do not shave (including legs and underarms) for at least 48 hours prior to the first CHG shower.  You may shave your face/neck.  Please follow these instructions carefully:  1.  Shower with CHG Soap the night before surgery and the  morning of surgery.  2.  If you choose to wash your hair, wash your hair first as usual with your normal  shampoo.  3.  After you shampoo, rinse your hair and body thoroughly to remove the shampoo.                             4.  Use CHG as you would any other liquid soap.  You  can apply chg directly to the skin and wash.  Gently with a scrungie or clean washcloth.  5.  Apply the CHG Soap to your body ONLY FROM THE NECK DOWN.   Do   not use on face/ open                           Wound or open sores. Avoid contact with eyes, ears mouth and   genitals (private parts).                       Wash face,  Genitals (private parts) with your normal soap.             6.  Wash thoroughly, paying special attention to the area where your    surgery  will be performed.  7.  Thoroughly rinse your body with warm water from the neck down.  8.  DO NOT shower/wash with your normal soap after using and rinsing off the CHG Soap.                 9.  Pat yourself dry with a clean towel.            10.  Wear clean pajamas.            11.  Place clean sheets on your bed the night of your first shower and do not  sleep with pets. Day of Surgery : Do not apply any lotions/deodorants the morning of surgery.  Please wear clean clothes to the hospital/surgery center.  FAILURE TO FOLLOW THESE INSTRUCTIONS MAY RESULT IN THE CANCELLATION OF YOUR SURGERY  PATIENT SIGNATURE_________________________________  NURSE SIGNATURE__________________________________  ________________________________________________________________________
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
## 2022-12-06 NOTE — Progress Notes (Signed)
Please place orders for PAT appointment scheduled 12/09/22. 

## 2022-12-09 ENCOUNTER — Other Ambulatory Visit: Payer: Self-pay

## 2022-12-09 ENCOUNTER — Encounter (HOSPITAL_COMMUNITY): Payer: Self-pay

## 2022-12-09 ENCOUNTER — Encounter (HOSPITAL_COMMUNITY)
Admission: RE | Admit: 2022-12-09 | Discharge: 2022-12-09 | Disposition: A | Discharge status: Home / Self Care | Payer: Managed Care, Other (non HMO) | Source: Ambulatory Visit | Attending: Specialist | Admitting: Specialist

## 2022-12-11 NOTE — Progress Notes (Signed)
Case: [id number] Date/Time: 12/20/22 0915   Procedure: RIGHT SHOULDER ARTHROSCOPY, DEBRIDEMENT, SUBACROMIAL DECOMPRESSION, MINI OPEN ROTATOR CUFF REPAIR, POSSIBLE PATCH GRAFT, BICEPS TENOTOMY VS TENODESIS (Right)   Anesthesia type: General   Pre-op diagnosis: Right shoulder rotator cuff tear   Location: [facility] ROOM 08 / [facility]   Surgeons: Richard Every, MD       DISCUSSION: Richard Sanford is a 57 yo male who is being evaluated prior to surgery listed above. Interview conducted by PAT RN over the phone. Surgery was previously scheduled in May but rescheduled due to needing cardiac clearance. PMH significant for current smoking, GERD, COPD, CAD s/p drug-eluting stent 11/01/2021, right shoulder rotator cuff tear.  Patient had a telephone visit with Cardiology on 11/05/2022. Noted to be doing well from cardiac standpoint. Risk assessment/clearance provided:  "Preoperative Cardiovascular Risk Assessment:   [name] perioperative risk of a major cardiac event is 0.9% according to the Revised Cardiac Risk Index (RCRI).  Therefore, he is at low risk for perioperative complications.   His functional capacity is good at 5.62 METs according to the Duke Activity Status Index (DASI). Recommendations: According to ACC/AHA guidelines, no further cardiovascular testing needed.  The patient may proceed to surgery at acceptable risk.   Antiplatelet and/or Anticoagulation Recommendations: The patient should remain on Aspirin without interruption.   Clopidogrel (Plavix) can be held for 5 days prior to his surgery and resumed as soon as possible post op."  Of note patient has not been taking Plavix for several months.  Pt seen by PCP 11/25/2022 for 3-4 days of cough, diagnosed with COPD exacerbation treated with azithromycin, prednisone. Chest xray negative for pneumonia. It has now been over 4 weeks since diagnosis and patient denied any symptoms over the phone.    VS: Ht 6\' 2"  (1.88 m)   Wt 76.2 kg   BMI  21.57 kg/m      12/09/2022    8:05 AM 11/21/2022    9:02 AM 10/24/2022    9:31 AM  Vitals with BMI  Height 6\' 2"  6\' 1"    Weight 168 lbs 162 lbs   BMI 21.56 21.38   Systolic  133 157  Diastolic  84 76  Pulse  72      PROVIDERS: PCP - Richard Barre, MD Cardiologist - Richard Balsam, MD   LABS: Labs reviewed: Acceptable for surgery. (all labs ordered are listed, but only abnormal results are displayed)  Labs Reviewed - No data to display   IMAGES:   CXR 11/25/22:  FINDINGS:  The heart size and mediastinal contours are within normal limits.  Stable mild scattered pulmonary scarring. There is no evidence of  pulmonary edema, consolidation, pneumothorax, nodule or pleural  fluid. The visualized skeletal structures are unremarkable.    EKG 05/08/2022:  Sinus bradycardia, rate 49   CV:  Left Heart Cath 11/01/21: Conclusions: Severe single-vessel coronary artery disease, with 80% mid LAD stenosis at the takeoff of D1 that is hemodynamically significant (RFR 0.84). Mild-moderate, non-obstructive disease more distally in the LAD, as well as involving OM3 and RCA. Normal left ventricular systolic function (LVEF 55-65%) and filling pressure (LVEDP 10 mmHg). Successful OCT-guided PCI to mid LAD using Synergy 3.0 x 12 mm drug-eluting stent with 0% residual stenosis and TIMI-3 flow.  Small D1 branch was jailed with resultant plaque shift into the ostium but TIMI-3 flow.  Intervention was not attempted on D1.   Recommendations: Dual antiplatelet therapy with aspirin and clopidogrel for at least 6 months. Aggressive secondary  prevention. Anticipate same-day discharge home following 6 hours of post-PCI observation if no complications occur.  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/16/2008   Qualifier: Diagnosis of  By: Richard Sanford     COPD  (chronic obstructive pulmonary disease) (HCC)    Depression    Dyspnea    EMPHYSEMA 07/04/2010   Qualifier: Diagnosis of  By: Richard Sanford     Esophageal reflux 06/16/2008   Qualifier: Diagnosis of  By: Richard Sanford     GERD (gastroesophageal reflux disease)    H/O: gout    Headache    HEMOPTYSIS UNSPECIFIED 07/04/2010   Qualifier: Diagnosis of  By: Richard Sanford    History of kidney stones    Hypercholesterolemia    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   Qualifier: Diagnosis of  By: Richard Sanford     Mental disorder    bipolar   Pneumonia    Rotator cuff tear, left     Past Surgical History:  Procedure Laterality Date   CHOLECYSTECTOMY     CORONARY IMAGING/OCT N/A 11/01/2021   Procedure: INTRAVASCULAR IMAGING/OCT;  Surgeon: Richard Kendall, MD;  Location: [facility];  Service: Cardiovascular;  Laterality: N/A;   CORONARY PRESSURE/FFR STUDY N/A 11/01/2021   Procedure: INTRAVASCULAR PRESSURE WIRE/FFR STUDY;  Surgeon: Richard Kendall, MD;  Location: [facility];  Service: Cardiovascular;  Laterality: N/A;   CORONARY STENT INTERVENTION N/A 11/01/2021   Procedure: CORONARY STENT INTERVENTION;  Surgeon: Richard Kendall, MD;  Location: [facility];  Service: Cardiovascular;  Laterality: N/A;   LEFT HEART CATH AND CORONARY ANGIOGRAPHY N/A 11/01/2021   Procedure: LEFT HEART CATH AND CORONARY ANGIOGRAPHY;  Surgeon: Richard Kendall, MD;  Location: [facility];  Service: Cardiovascular;  Laterality: N/A;   SHOULDER OPEN ROTATOR CUFF REPAIR  09/26/2011   Procedure: ROTATOR CUFF REPAIR SHOULDER OPEN;  Surgeon: Richard Schmidt, MD;  Location: [facility];  Service: Orthopedics;  Laterality: Left;  Open Left Shoulder Anterior Acrominectomy and Rotator Cuff Repair   TOOTH EXTRACTION     VASECTOMY      MEDICATIONS:  albuterol (VENTOLIN HFA) 108 (90 Base) MCG/ACT inhaler   aspirin EC 81 MG tablet   Aspirin-Acetaminophen-Caffeine (GOODY HEADACHE PO)    clopidogrel (PLAVIX) 75 MG tablet   gabapentin (NEURONTIN) 300 MG capsule   HYDROcodone-acetaminophen (NORCO/VICODIN) 5-325 MG tablet   lamoTRIgine (LAMICTAL) 200 MG tablet   lithium carbonate 300 MG capsule   metoprolol tartrate (LOPRESSOR) 25 MG tablet   nitroGLYCERIN (NITROSTAT) 0.4 MG SL tablet   pantoprazole (PROTONIX) 40 MG tablet   PRESCRIPTION MEDICATION   ranolazine (RANEXA) 500 MG 12 hr tablet   rosuvastatin (CRESTOR) 10 MG tablet   sildenafil (VIAGRA) 50 MG tablet   VITAMIN D PO   VITAMIN E PO   No current facility-administered medications for this encounter.   Marcille Blanco [facility] Short Stay/Anesthesiology [facility] Phone [phone] 12/11/2022 12:59 PM

## 2022-12-11 NOTE — Anesthesia Preprocedure Evaluation (Addendum)
Anesthesia Evaluation  Patient identified by MRN, date of birth, ID band Patient awake    Reviewed: Allergy & Precautions, NPO status , Patient's Chart, lab work & pertinent test results  Airway Mallampati: II  TM Distance: >3 FB Neck ROM: Full    Dental no notable dental hx. (+) Edentulous Upper, Edentulous Lower   Pulmonary asthma , COPD, Current Smoker and Patient abstained from smoking.   Pulmonary exam normal        Cardiovascular + CAD and + Cardiac Stents (05/23)   Rhythm:Regular Rate:Normal   CV:  Left Heart Cath 11/01/21: Conclusions: 1. Severe single-vessel coronary artery disease, with 80% mid LAD stenosis at the takeoff of D1 that is hemodynamically significant (RFR 0.84). 2. Mild-moderate, non-obstructive disease more distally in the LAD, as well as involving OM3 and RCA. 3. Normal left ventricular systolic function (LVEF 55-65%) and filling pressure (LVEDP 10 mmHg). 4. Successful OCT-guided PCI to mid LAD using Synergy 3.0 x 12 mm drug-eluting stent with 0% residual stenosis and TIMI-3 flow.  Small D1 branch was jailed with resultant plaque shift into the ostium but TIMI-3 flow.  Intervention was not attempted on D1.    Neuro/Psych  Headaches   Depression Bipolar Disorder      GI/Hepatic Neg liver ROS,GERD  ,,  Endo/Other  negative endocrine ROS    Renal/GU negative Renal ROS  negative genitourinary   Musculoskeletal  (+) Arthritis , Osteoarthritis,    Abdominal Normal abdominal exam  (+)   Peds  Hematology Lab Results      Component                Value               Date                      WBC                      8.4                 11/21/2022                HGB                      14.3                11/21/2022                HCT                      45.3                11/21/2022                MCV                      102.5 (H)           11/21/2022                PLT                      222                  11/21/2022              Anesthesia Other Findings   Reproductive/Obstetrics  Anesthesia Physical Anesthesia Plan  ASA: 3  Anesthesia Plan: General and Regional   Post-op Pain Management: Regional block*   Induction: Intravenous  PONV Risk Score and Plan: 1 and Ondansetron, Dexamethasone, Midazolam and Treatment may vary due to age or medical condition  Airway Management Planned: Mask and Oral ETT  Additional Equipment: None  Intra-op Plan:   Post-operative Plan: Extubation in OR  Informed Consent: I have reviewed the patients History and Physical, chart, labs and discussed the procedure including the risks, benefits and alternatives for the proposed anesthesia with the patient or authorized representative who has indicated his/her understanding and acceptance.     Dental advisory given  Plan Discussed with: CRNA  Anesthesia Plan Comments: (See PAT note from 6/17 by Sherlie Ban PA-C )        Anesthesia Quick Evaluation

## 2022-12-20 ENCOUNTER — Ambulatory Visit (HOSPITAL_COMMUNITY): Payer: Managed Care, Other (non HMO) | Admitting: Physician Assistant

## 2022-12-20 ENCOUNTER — Other Ambulatory Visit: Payer: Self-pay

## 2022-12-20 ENCOUNTER — Ambulatory Visit (HOSPITAL_COMMUNITY)
Admission: RE | Admit: 2022-12-20 | Discharge: 2022-12-20 | Disposition: A | Discharge status: Home / Self Care | Payer: Managed Care, Other (non HMO) | Source: Ambulatory Visit | Attending: Specialist | Admitting: Specialist

## 2022-12-20 ENCOUNTER — Encounter (HOSPITAL_COMMUNITY): Payer: Self-pay | Admitting: Specialist

## 2022-12-20 ENCOUNTER — Encounter (HOSPITAL_COMMUNITY)
Admission: RE | Disposition: A | Discharge status: Home / Self Care | Payer: Self-pay | Source: Ambulatory Visit | Attending: Specialist

## 2022-12-20 DIAGNOSIS — M75121 Complete rotator cuff tear or rupture of right shoulder, not specified as traumatic: Secondary | ICD-10-CM

## 2022-12-20 DIAGNOSIS — S46111A Strain of muscle, fascia and tendon of long head of biceps, right arm, initial encounter: Secondary | ICD-10-CM | POA: Insufficient documentation

## 2022-12-20 DIAGNOSIS — K219 Gastro-esophageal reflux disease without esophagitis: Secondary | ICD-10-CM | POA: Diagnosis not present

## 2022-12-20 DIAGNOSIS — I2510 Atherosclerotic heart disease of native coronary artery without angina pectoris: Secondary | ICD-10-CM

## 2022-12-20 DIAGNOSIS — M7521 Bicipital tendinitis, right shoulder: Secondary | ICD-10-CM

## 2022-12-20 DIAGNOSIS — S43401A Unspecified sprain of right shoulder joint, initial encounter: Secondary | ICD-10-CM | POA: Diagnosis not present

## 2022-12-20 DIAGNOSIS — F319 Bipolar disorder, unspecified: Secondary | ICD-10-CM | POA: Insufficient documentation

## 2022-12-20 DIAGNOSIS — X58XXXA Exposure to other specified factors, initial encounter: Secondary | ICD-10-CM | POA: Insufficient documentation

## 2022-12-20 DIAGNOSIS — S43431A Superior glenoid labrum lesion of right shoulder, initial encounter: Secondary | ICD-10-CM | POA: Diagnosis not present

## 2022-12-20 DIAGNOSIS — S46011A Strain of muscle(s) and tendon(s) of the rotator cuff of right shoulder, initial encounter: Secondary | ICD-10-CM | POA: Diagnosis present

## 2022-12-20 DIAGNOSIS — Z955 Presence of coronary angioplasty implant and graft: Secondary | ICD-10-CM | POA: Diagnosis not present

## 2022-12-20 DIAGNOSIS — F17210 Nicotine dependence, cigarettes, uncomplicated: Secondary | ICD-10-CM | POA: Diagnosis not present

## 2022-12-20 HISTORY — PX: SHOULDER ARTHROSCOPY WITH ROTATOR CUFF REPAIR AND SUBACROMIAL DECOMPRESSION: SHX5686

## 2022-12-20 SURGERY — SHOULDER ARTHROSCOPY WITH ROTATOR CUFF REPAIR AND SUBACROMIAL DECOMPRESSION
Anesthesia: Regional | Site: Shoulder | Laterality: Right

## 2022-12-20 MED ORDER — SUGAMMADEX SODIUM 200 MG/2ML IV SOLN
INTRAVENOUS | Status: DC | PRN
  Administered 2022-12-20: 50 mg via INTRAVENOUS
  Administered 2022-12-20: 200 mg via INTRAVENOUS

## 2022-12-20 MED ORDER — BUPIVACAINE-EPINEPHRINE 0.5% -1:200000 IJ SOLN
INTRAMUSCULAR | Status: DC | PRN
  Administered 2022-12-20: 30 mL

## 2022-12-20 MED ORDER — OXYCODONE HCL 5 MG PO TABS: 5.0000 mg | ORAL_TABLET | ORAL | 0 refills | Status: AC | PRN

## 2022-12-20 MED ORDER — FENTANYL CITRATE PF 50 MCG/ML IJ SOSY: 25.0000 ug | PREFILLED_SYRINGE | INTRAMUSCULAR | Status: DC | PRN

## 2022-12-20 MED ORDER — MIDAZOLAM HCL 2 MG/2ML IJ SOLN
1.0000 mg | INTRAMUSCULAR | Status: DC
  Administered 2022-12-20: 2 mg via INTRAVENOUS
  Filled 2022-12-20: qty 2

## 2022-12-20 MED ORDER — PHENYLEPHRINE HCL-NACL 20-0.9 MG/250ML-% IV SOLN
INTRAVENOUS | Status: AC
  Filled 2022-12-20: qty 250

## 2022-12-20 MED ORDER — LACTATED RINGERS IV SOLN: INTRAVENOUS | Status: DC

## 2022-12-20 MED ORDER — BUPIVACAINE LIPOSOME 1.3 % IJ SUSP
INTRAMUSCULAR | Status: DC | PRN
  Administered 2022-12-20: 10 mL via PERINEURAL

## 2022-12-20 MED ORDER — DOCUSATE SODIUM 100 MG PO CAPS: 100.0000 mg | ORAL_CAPSULE | Freq: Two times a day (BID) | ORAL | 1 refills | Status: AC | PRN

## 2022-12-20 MED ORDER — ONDANSETRON HCL 4 MG/2ML IJ SOLN
INTRAMUSCULAR | Status: DC | PRN
  Administered 2022-12-20: 4 mg via INTRAVENOUS

## 2022-12-20 MED ORDER — ROCURONIUM BROMIDE 100 MG/10ML IV SOLN
INTRAVENOUS | Status: DC | PRN
  Administered 2022-12-20: 50 mg via INTRAVENOUS

## 2022-12-20 MED ORDER — ORAL CARE MOUTH RINSE: 15.0000 mL | Freq: Once | OROMUCOSAL | Status: AC

## 2022-12-20 MED ORDER — POLYETHYLENE GLYCOL 3350 17 G PO PACK: 17.0000 g | PACK | Freq: Every day | ORAL | 0 refills | Status: AC

## 2022-12-20 MED ORDER — PHENYLEPHRINE HCL-NACL 20-0.9 MG/250ML-% IV SOLN
INTRAVENOUS | Status: DC | PRN
  Administered 2022-12-20: 30 ug/min via INTRAVENOUS

## 2022-12-20 MED ORDER — DEXAMETHASONE SODIUM PHOSPHATE 10 MG/ML IJ SOLN
INTRAMUSCULAR | Status: DC | PRN
  Administered 2022-12-20: 4 mg via INTRAVENOUS

## 2022-12-20 MED ORDER — BUPIVACAINE-EPINEPHRINE (PF) 0.5% -1:200000 IJ SOLN
INTRAMUSCULAR | Status: AC
  Filled 2022-12-20: qty 30

## 2022-12-20 MED ORDER — BUPIVACAINE HCL (PF) 0.5 % IJ SOLN
INTRAMUSCULAR | Status: DC | PRN
  Administered 2022-12-20: 15 mL via PERINEURAL

## 2022-12-20 MED ORDER — ALBUTEROL SULFATE (2.5 MG/3ML) 0.083% IN NEBU
INHALATION_SOLUTION | RESPIRATORY_TRACT | Status: AC
  Filled 2022-12-20: qty 3

## 2022-12-20 MED ORDER — 0.9 % SODIUM CHLORIDE (POUR BTL) OPTIME
TOPICAL | Status: DC | PRN
  Administered 2022-12-20: 1000 mL

## 2022-12-20 MED ORDER — FENTANYL CITRATE PF 50 MCG/ML IJ SOSY
50.0000 ug | PREFILLED_SYRINGE | INTRAMUSCULAR | Status: DC
  Administered 2022-12-20: 50 ug via INTRAVENOUS
  Filled 2022-12-20: qty 2

## 2022-12-20 MED ORDER — ASPIRIN EC 81 MG PO TBEC: 81.0000 mg | DELAYED_RELEASE_TABLET | Freq: Every day | ORAL | 3 refills | Status: AC

## 2022-12-20 MED ORDER — CEFAZOLIN SODIUM-DEXTROSE 2-4 GM/100ML-% IV SOLN
2.0000 g | Freq: Once | INTRAVENOUS | Status: AC
  Administered 2022-12-20: 2 g via INTRAVENOUS
  Filled 2022-12-20: qty 100

## 2022-12-20 MED ORDER — ACETAMINOPHEN 10 MG/ML IV SOLN: 1000.0000 mg | Freq: Once | INTRAVENOUS | Status: DC | PRN

## 2022-12-20 MED ORDER — CEPHALEXIN 500 MG PO CAPS: 500.0000 mg | ORAL_CAPSULE | Freq: Four times a day (QID) | ORAL | 1 refills | Status: AC

## 2022-12-20 MED ORDER — METHOCARBAMOL 500 MG PO TABS: 500.0000 mg | ORAL_TABLET | Freq: Three times a day (TID) | ORAL | 1 refills | Status: AC | PRN

## 2022-12-20 MED ORDER — PROPOFOL 10 MG/ML IV BOLUS
INTRAVENOUS | Status: DC | PRN
  Administered 2022-12-20: 140 mg via INTRAVENOUS

## 2022-12-20 MED ORDER — EPINEPHRINE PF 1 MG/ML IJ SOLN
INTRAMUSCULAR | Status: DC | PRN
  Administered 2022-12-20: 2 mL

## 2022-12-20 MED ORDER — EPHEDRINE SULFATE (PRESSORS) 50 MG/ML IJ SOLN
INTRAMUSCULAR | Status: DC | PRN
  Administered 2022-12-20: 5 mg via INTRAVENOUS

## 2022-12-20 MED ORDER — EPINEPHRINE PF 1 MG/ML IJ SOLN
INTRAMUSCULAR | Status: AC
  Filled 2022-12-20: qty 2

## 2022-12-20 MED ORDER — FENTANYL CITRATE (PF) 100 MCG/2ML IJ SOLN
INTRAMUSCULAR | Status: DC | PRN
  Administered 2022-12-20: 50 ug via INTRAVENOUS

## 2022-12-20 MED ORDER — ALBUTEROL SULFATE (2.5 MG/3ML) 0.083% IN NEBU
2.5000 mg | INHALATION_SOLUTION | Freq: Four times a day (QID) | RESPIRATORY_TRACT | Status: DC | PRN
  Administered 2022-12-20: 2.5 mg via RESPIRATORY_TRACT

## 2022-12-20 MED ORDER — SODIUM CHLORIDE 0.9 % IR SOLN
Status: DC | PRN
  Administered 2022-12-20: 6000 mL

## 2022-12-20 MED ORDER — LIDOCAINE HCL (CARDIAC) PF 100 MG/5ML IV SOSY
PREFILLED_SYRINGE | INTRAVENOUS | Status: DC | PRN
  Administered 2022-12-20: 50 mg via INTRAVENOUS

## 2022-12-20 MED ORDER — CHLORHEXIDINE GLUCONATE 0.12 % MT SOLN
15.0000 mL | Freq: Once | OROMUCOSAL | Status: AC
  Administered 2022-12-20: 15 mL via OROMUCOSAL

## 2022-12-20 SURGICAL SUPPLY — 57 items
AID PSTN UNV HD RSTRNT DISP (MISCELLANEOUS) ×1
ANCH SUT 2 SWLK 19.1 CLS EYLT (Anchor) ×2 IMPLANT
ANCH SUT SWLK 19.1X4.75 (Anchor) ×2 IMPLANT
ANCH SUT SWLK 19.1X4.75 VT (Anchor) IMPLANT
ANCH SUT SWLK 19.1X5.5 CLS EL (Anchor) IMPLANT
ANCHOR SUT BIO SW 4.75X19.1 (Anchor) IMPLANT
ANCHOR SWIVELOCK BIO 4.75X19.1 (Anchor) IMPLANT
BLADE SHAVER TORPEDO 4X13 (MISCELLANEOUS) ×1 IMPLANT
BLADE SURG SZ11 CARB STEEL (BLADE) ×1 IMPLANT
CANNULA ACUFO 5X76 (CANNULA) IMPLANT
COVER SURGICAL LIGHT HANDLE (MISCELLANEOUS) ×1 IMPLANT
DRAPE FOOT SWITCH (DRAPES) ×2 IMPLANT
DRAPE IMP U-DRAPE 54X76 (DRAPES) ×1 IMPLANT
DRAPE ORTHO SPLIT 77X108 STRL (DRAPES) ×2
DRAPE POUCH INSTRU U-SHP 10X18 (DRAPES) ×1 IMPLANT
DRAPE STERI 35X30 U-POUCH (DRAPES) ×1 IMPLANT
DRAPE SURG ORHT 6 SPLT 77X108 (DRAPES) ×2 IMPLANT
DRSG AQUACEL AG ADV 3.5X 6 (GAUZE/BANDAGES/DRESSINGS) IMPLANT
DURAPREP 26ML APPLICATOR (WOUND CARE) ×1 IMPLANT
DW OUTFLOW CASSETTE/TUBE SET (MISCELLANEOUS) ×1 IMPLANT
ELECT NDL TIP 2.8 STRL (NEEDLE) ×1 IMPLANT
ELECT NEEDLE TIP 2.8 STRL (NEEDLE) ×1 IMPLANT
ELECT REM PT RETURN 15FT ADLT (MISCELLANEOUS) ×1 IMPLANT
FILTER STRAW (MISCELLANEOUS) ×1 IMPLANT
GLOVE BIOGEL PI IND STRL 7.0 (GLOVE) ×1 IMPLANT
GLOVE BIOGEL PI IND STRL 8 (GLOVE) ×1 IMPLANT
GLOVE SURG SS PI 8.0 STRL IVOR (GLOVE) ×2 IMPLANT
GOWN STRL REUS W/ TWL XL LVL3 (GOWN DISPOSABLE) ×2 IMPLANT
GOWN STRL REUS W/TWL XL LVL3 (GOWN DISPOSABLE) ×2
KIT BASIN OR (CUSTOM PROCEDURE TRAY) ×1 IMPLANT
KIT TURNOVER KIT A (KITS) IMPLANT
MANIFOLD NEPTUNE II (INSTRUMENTS) ×1 IMPLANT
NDL HD SCORPION MEGA LOADER (NEEDLE) IMPLANT
NDL SPNL 18GX3.5 QUINCKE PK (NEEDLE) ×1 IMPLANT
NEEDLE SPNL 18GX3.5 QUINCKE PK (NEEDLE) ×1 IMPLANT
PACK SHOULDER (CUSTOM PROCEDURE TRAY) ×1 IMPLANT
PORT APPOLLO RF 90DEGREE MULTI (SURGICAL WAND) ×1 IMPLANT
PROTECTOR NERVE ULNAR (MISCELLANEOUS) ×1 IMPLANT
RESTRAINT HEAD UNIVERSAL NS (MISCELLANEOUS) ×1 IMPLANT
SLING ARM FOAM STRAP LRG (SOFTGOODS) IMPLANT
SLING ULTRA II L (ORTHOPEDIC SUPPLIES) IMPLANT
SOLUTION PRONTOSAN WOUND 350ML (IRRIGATION / IRRIGATOR) IMPLANT
STRIP CLOSURE SKIN 1/2X4 (GAUZE/BANDAGES/DRESSINGS) IMPLANT
SUCTION TUBE FRAZIER 12FR DISP (SUCTIONS) ×1 IMPLANT
SUT ETHILON 4 0 PS 2 18 (SUTURE) ×1 IMPLANT
SUT PROLENE 3 0 PS 2 (SUTURE) ×1 IMPLANT
SUT VIC AB 0 CT1 27 (SUTURE) ×1
SUT VIC AB 0 CT1 27XBRD ANTBC (SUTURE) ×1 IMPLANT
SUT VIC AB 2-0 CT2 27 (SUTURE) IMPLANT
SUT VICRYL 0 UR6 27IN ABS (SUTURE) ×1 IMPLANT
SYR 20ML LL LF (SYRINGE) ×1 IMPLANT
TAPE FIBER 2MM 7IN #2 BLUE (SUTURE) IMPLANT
TOWEL OR 17X26 10 PK STRL BLUE (TOWEL DISPOSABLE) ×1 IMPLANT
TUBING ARTHROSCOPY IRRIG 16FT (MISCELLANEOUS) ×1 IMPLANT
TUBING CONNECTING 10 (TUBING) ×1 IMPLANT
WAND ABLATOR APOLLO I90 (BUR) IMPLANT
WIPE CHG 2% PREP (PERSONAL CARE ITEMS) ×1 IMPLANT

## 2022-12-20 NOTE — Anesthesia Postprocedure Evaluation (Signed)
Anesthesia Post Note  Patient: Richard Sanford  Procedure(s) Performed: RIGHT SHOULDER ARTHROSCOPY, DEBRIDEMENT, SUBACROMIAL DECOMPRESSION, MINI OPEN ROTATOR CUFF REPAIR, BICEPS AND LABRIAL DEBRIDEMENT (Right: Shoulder)     Patient location during evaluation: PACU Anesthesia Type: Regional and General Level of consciousness: awake and alert Pain management: pain level controlled Vital Signs Assessment: post-procedure vital signs reviewed and stable Respiratory status: spontaneous breathing, nonlabored ventilation, respiratory function stable and patient connected to nasal cannula oxygen Cardiovascular status: blood pressure returned to baseline and stable Postop Assessment: no apparent nausea or vomiting Anesthetic complications: no   No notable events documented.  Last Vitals:  Vitals:   12/20/22 1249 12/20/22 1300  BP:  119/72  Pulse: 67 60  Resp: 15 15  Temp:    SpO2: 99% 93%    Last Pain:  Vitals:   12/20/22 1300  TempSrc:   PainSc: 0-No pain                 Earl Lites P Kaeden Depaz

## 2022-12-20 NOTE — Interval H&P Note (Signed)
History and Physical Interval Note:  12/20/2022 10:04 AM  Richard Sanford  has presented today for surgery, with the diagnosis of Right shoulder rotator cuff tear.  The various methods of treatment have been discussed with the patient and family. After consideration of risks, benefits and other options for treatment, the patient has consented to  Procedure(s): RIGHT SHOULDER ARTHROSCOPY, DEBRIDEMENT, SUBACROMIAL DECOMPRESSION, MINI OPEN ROTATOR CUFF REPAIR, POSSIBLE PATCH GRAFT, BICEPS TENOTOMY VS TENODESIS (Right) as a surgical intervention.  The patient's history has been reviewed, patient examined, no change in status, stable for surgery.  I have reviewed the patient's chart and labs.  Questions were answered to the patient's satisfaction.     Javier Docker

## 2022-12-20 NOTE — Anesthesia Procedure Notes (Signed)
Procedure Name: Intubation Date/Time: 12/20/2022 10:36 AM  Performed by: Garth Bigness, CRNAPre-anesthesia Checklist: Patient identified, Emergency Drugs available, Suction available and Patient being monitored Patient Re-evaluated:Patient Re-evaluated prior to induction Oxygen Delivery Method: Circle system utilized Preoxygenation: Pre-oxygenation with 100% oxygen Induction Type: IV induction Ventilation: Mask ventilation without difficulty Laryngoscope Size: Mac and 4 Tube type: Oral Tube size: 7.5 mm Number of attempts: 1 Airway Equipment and Method: Stylet Placement Confirmation: ETT inserted through vocal cords under direct vision, positive ETCO2 and breath sounds checked- equal and bilateral Secured at: 23 cm Tube secured with: Tape Dental Injury: Teeth and Oropharynx as per pre-operative assessment

## 2022-12-20 NOTE — Transfer of Care (Signed)
Immediate Anesthesia Transfer of Care Note  Patient: Richard Sanford  Procedure(s) Performed: RIGHT SHOULDER ARTHROSCOPY, DEBRIDEMENT, SUBACROMIAL DECOMPRESSION, MINI OPEN ROTATOR CUFF REPAIR, BICEPS AND LABRIAL DEBRIDEMENT (Right: Shoulder)  Patient Location: PACU  Anesthesia Type:General  Level of Consciousness: awake, alert , oriented, and patient cooperative  Airway & Oxygen Therapy: Patient Spontanous Breathing and Patient connected to face mask oxygen  Post-op Assessment: Report given to RN and Post -op Vital signs reviewed and stable  Post vital signs: Reviewed and stable  Last Vitals:  Vitals Value Taken Time  BP 152/81 12/20/22 1215  Temp    Pulse 73 12/20/22 1217  Resp 16 12/20/22 1217  SpO2 100 % 12/20/22 1217  Vitals shown include unvalidated device data.  Last Pain:  Vitals:   12/20/22 1020  TempSrc:   PainSc: 0-No pain         Complications: No notable events documented.

## 2022-12-20 NOTE — Anesthesia Procedure Notes (Signed)
Anesthesia Regional Block: Interscalene brachial plexus block   Pre-Anesthetic Checklist: , timeout performed,  Correct Patient, Correct Site, Correct Laterality,  Correct Procedure, Correct Position, site marked,  Risks and benefits discussed,  Surgical consent,  Pre-op evaluation,  At surgeon's request and post-op pain management  Laterality: Right  Prep: Dura Prep       Needles:  Injection technique: Single-shot  Needle Type: Echogenic Stimulator Needle     Needle Length: 5cm  Needle Gauge: 20     Additional Needles:   Procedures:,,,, ultrasound used (permanent image in chart),,    Narrative:  Start time: 12/20/2022 10:10 AM End time: 12/20/2022 10:14 AM Injection made incrementally with aspirations every 5 mL.  Performed by: Personally  Anesthesiologist: Atilano Median, DO  Additional Notes: Patient identified. Risks/Benefits/Options discussed with patient including but not limited to bleeding, infection, nerve damage, failed block, incomplete pain control. Patient expressed understanding and wished to proceed. All questions were answered. Sterile technique was used throughout the entire procedure. Please see nursing notes for vital signs. Aspirated in 5cc intervals with injection for negative confirmation. Patient was given instructions on fall risk and not to get out of bed. All questions and concerns addressed with instructions to call with any issues or inadequate analgesia.

## 2022-12-20 NOTE — Op Note (Signed)
NAME: Richard Sanford, MELON MEDICAL RECORD NO: [id number] ACCOUNT NO: [id number] DATE OF BIRTH: [birth year] FACILITY: [facility] LOCATION: [facility] PHYSICIAN: Javier Docker, MD  Operative Report   DATE OF PROCEDURE: 12/20/2022  PREOPERATIVE DIAGNOSES:  Acute rotator cuff tear, right shoulder, labral tear, bicipital partial tendon tear.  POSTOPERATIVE DIAGNOSES:  Acute rotator cuff tear, right shoulder, labral tear, bicipital partial tendon tear.  PROCEDURE PERFORMED:  Right shoulder arthroscopy with subacromial decompression, release of CA ligament, subacromial bursectomy, subdeltoid bursectomy, debridement anterior superior labrum as well as partial tear of the biceps tendon followed by mini  open rotator cuff repair utilizing SwiveLock suture anchors x4.  ANESTHESIA:  General with regional block.  ASSISTANT:  Andrez Grime, PA.  HISTORY:  A 57 year old with acute tear of the rotator cuff, infraspinatus, supraspinatus indicated for repair, arthroscopic debridement and subacromial decompression.  Risks and benefits discussed including bleeding, infection, damage to neurovascular  structures, no change in symptoms, worsening symptoms, DVT, PE, anesthetic complications, etc.  TECHNIQUE:  With the patient in supine beach chair position, after induction of adequate general anesthesia, 2 grams Kefzol, the right shoulder and upper extremity was prepped and draped in the usual sterile fashion.  Prior to that, range of motion  revealed a full range.  A surgical marker was utilized to delineate the acromion, AC joint, coracoid.  Standard posterolateral portal was incised through the skin only with a #11 blade.  I advanced the arthroscopic cannula into the glenohumeral joint,  penetrating atraumatically in line with the coracoid.  An anterolateral portal was fashioned with a #11 blade.  I triangulated in the glenohumeral joint through the full thickness tear of the rotator cuff.  He had a large retracted  tear of the rotator  cuff to the articular surface.  There was tearing of the anterior superior labrum.  I introduced a shaver and debrided the anterior superior labrum with a small tear approximately 10% to 15% of the biceps tendon with a partial tear.  This was debrided.   The remainder was intact.  The chondral surfaces were unremarkable as well as the subscap.  I redirected into the subacromial space.  I performed a full bursectomy subdeltoid and sub-bursal.  There was hypertrophic bursa noted.  The CA ligament was released and I shaved the anterolateral aspect of the acromion, debrided the edges of the rotator  cuff tear, supraspinatus, infraspinatus, and the greater tuberosity.  Following this, all instrumentation was then removed.  I closed the portals with 4-0 nylon simple sutures.  Then, I proceeded with a mini open rotator cuff repair.  A 2 cm incision was made over the anterolateral aspect of the acromion.  Subcutaneous tissue was dissected.  Electrocautery was utilized to achieve hemostasis.  Raphae between the anterior and lateral heads was divided in line with skin incision.  A  self-retaining retractor was placed.  I entered the subacromial space and evacuated the irrigant.  The irrigant was at 35 mmHg during the arthroscopy.  Kerrison was utilized to remove a small spur off the anterolateral aspect of the acromion, used a  Beyer rongeur to decorticate and create a bleeding bed of bone in the greater tuberosity.  The tendon was mobilized on its articular and bursal surfaces with a pituitary and a Cobb.  Then, I put two SwiveLocks just lateral to the articular surface  posterior to the bicipital groove separated by 1.5 cm.  Good bone.  Mobilized with an awl inserted 2 SwiveLocks with TigerTape.  Excellent resistance to pullout.  We used the Scorpion suture passer to pass, 2 in the infraspinatus, supraspinatus  posteriorly and 2 in the supraspinatus anteriorly and medially.  These were then  crossed and then secured in a second row of 2 SwiveLocks with the arm in neutral position at its side without undue tension.  We inserted these through the SwiveLocks after  piloting a hole with an awl.  This advanced the tendons into the cancellous bed of the greater tuberosity.  With full coverage, redundant suture removed, there was small bunching of the infraspinatus posteriorly and laterally.  This was remedied and  secured with utilization of the rescue sutures passed with a Scorpion and this pulled down on the remainder of the infraspinatus.  Full coverage.  Remainder of the tendon was unremarkable.  Copiously irrigated with antibiotic irrigation.  Closed the  raphae with 1 Vicryl in interrupted figure-of-eight sutures, subcutaneous with 2-0 and skin with subcuticular Prolene.  Sterile dressing applied, placed in a sling, extubated, and transported to the recovery room in satisfactory condition.  The patient tolerated the procedure well.  No complications.  ASSISTANT:  Andrez Grime, PA, was used throughout the case for patient positioning, traction to the upper extremity, monitoring inflow and outflow and closure.  BLOOD LOSS:  20 mL    PAA D: 12/20/2022 12:00:33 pm T: 12/20/2022 12:39:00 pm  JOB: [id number]/ [id number]

## 2022-12-20 NOTE — Discharge Instructions (Signed)

## 2022-12-20 NOTE — Brief Op Note (Addendum)
12/20/2022  10:10 AM  PATIENT:  Richard Sanford  57 y.o. male  PRE-OPERATIVE DIAGNOSIS:  Right shoulder rotator cuff tear  POST-OPERATIVE DIAGNOSIS:  Right shoulder rotator cuff tear  PROCEDURE:  Procedure(s): RIGHT SHOULDER ARTHROSCOPY, DEBRIDEMENT, SUBACROMIAL DECOMPRESSION, MINI OPEN ROTATOR CUFF REPAIR, POSSIBLE PATCH GRAFT, BICEPS TENOTOMY VS TENODESIS (Right) DIAGNOSES: Right shoulder, acute traumatic rotator cuff tear.  POST-OPERATIVE DIAGNOSIS: same  PROCEDURE: Open repair chronic rotator cuff tear - 16109 Arthroscopic extensive debridement - 29823 Subdeltoid Bursa, Supraspinatus Tendon, Anterior Labrum, Superior Labrum, and biceps tendon Arthroscopic subacromial decompression - 60454   OPERATIVE FINDING: Exam under anesthesia: Normal Articular space: Normal Chondral surfaces: Normal Biceps:  partial tear Subscapularis: nml Supraspinatus: Complete tear complete tear Infraspinatus: Complete tear complete tear  SURGEON:  Surgeon(s) and Role:    Jene Every, MD - Primary  PHYSICIAN ASSISTANT:   ASSISTANTS: Bissell   ANESTHESIA:   general  EBL:  20   BLOOD ADMINISTERED:none  DRAINS: none   LOCAL MEDICATIONS USED:  MARCAINE     SPECIMEN:  No Specimen  DISPOSITION OF SPECIMEN:  N/A  COUNTS:  YES  TOURNIQUET:  * No tourniquets in log *  DICTATION: .Other Dictation: Dictation Number   [id number]  PLAN OF CARE:  PATIENT DISPOSITION:  PACU - hemodynamically stable.   Delay start of Pharmacological VTE agent (>24hrs) due to surgical blood loss or risk of bleeding: no

## 2022-12-23 ENCOUNTER — Encounter (HOSPITAL_COMMUNITY): Payer: Self-pay | Admitting: Specialist

## 2023-04-07 NOTE — Progress Notes (Deleted)
Assessment/Plan:    1.  Essential Tremor  ***DaTscan completed December, 2023 was normal.  -Again, discussed the role that lithium plays in tremor.  Likely will not get control on tremor until this is adequately addressed.  Up to two thirds of patients on lithium are going to have tremor.  -As previous, there is some entrainment to his tremor and functional aspects cannot be ruled out.  Subjective:   Richard Sanford was seen today in follow up for tremor.  My previous records were reviewed prior to todays visit. Pt denies falls.  Pt had a DaTscan completed June 04, 2022, which was normal.  Last visit, we noted that lithium likely played a role.  We also noted that there was some entrainment to the tremor.  Finally, we talked about decreasing his [facility].  He reports he is still on lithium.  Tremor persists.  He saw his primary care physician about this on September 26 and was referred to neurology in [facility].  He did have a fall back in January on his arm and hurt his right shoulder.  He underwent right shoulder arthroscopy at the end of June.  Current prescribed movement disorder medications: ***   PREVIOUS MEDICATIONS: {Parkinson's RX:18200}  ALLERGIES:   Allergies  Allergen Reactions   Penicillins Swelling and Rash   Nsaids Other (See Comments)    Ulcers    Tolmetin Other (See Comments)    It is an NSAID    CURRENT MEDICATIONS:  No outpatient medications have been marked as taking for the 04/08/23 encounter (Appointment) with Kaleo Condrey, Octaviano Batty, DO.      Objective:    PHYSICAL EXAMINATION:    VITALS:  There were no vitals filed for this visit.  GEN:  The patient appears stated age and is in NAD. HEENT:  Normocephalic, atraumatic.  The mucous membranes are moist. The superficial temporal arteries are without ropiness or tenderness. CV:  RRR Lungs:  CTAB Neck/HEME:  There are no carotid bruits bilaterally.  Neurological examination: Orientation: The  patient is alert and oriented x3.  Cranial nerves: There is good facial symmetry.  Extraocular muscles are intact. The visual fields are full to confrontational testing. The speech is fluent and clear. Soft palate rises symmetrically and there is no tongue deviation. Hearing is intact to conversational tone. Sensation: Sensation is intact to light touch throughout (facial, trunk, extremities). Vibration is intact at the bilateral big toe. There is no extinction with double simultaneous stimulation.  Motor: Strength is 5/5 in the bilateral upper and lower extremities.   Shoulder shrug is equal and symmetric.  There is no pronator drift. Deep tendon reflexes: Deep tendon reflexes are 2/4 at the bilateral biceps, triceps, brachioradialis, patella and achilles. Plantar responses are downgoing bilaterally.   Movement examination: Tone: There is normal tone in the bilateral upper extremities.  The tone in the lower extremities is normal.  Abnormal movements: During the lengthy history taking, there was no evidence of tremor at rest.  When evaluating for rest tremor, he had irregular, variable amplitude tremor on both sides, right greater than left.  There is entrainment.  There is postural tremor, mild.  There is intention tremor, mild.  He has mild trouble drawing Archimedes spirals bilaterally.   Coordination:  There is slowness with decremation with RAM's, but no decremation Gait and Station: The patient pushes off to arise.  The patient's stride length is good.  The patient ambulates in a tandem fashion without trouble I  have reviewed and interpreted the following labs independently   Chemistry      Component Value Date/Time   NA 138 11/21/2022 0930   NA 140 10/31/2021 1502   K 3.7 11/21/2022 0930   CL 107 11/21/2022 0930   CO2 24 11/21/2022 0930   BUN 6 11/21/2022 0930   BUN 6 10/31/2021 1502   CREATININE 1.02 11/21/2022 0930      Component Value Date/Time   CALCIUM 9.5 11/21/2022 0930       Lab Results  Component Value Date   WBC 8.4 11/21/2022   HGB 14.3 11/21/2022   HCT 45.3 11/21/2022   MCV 102.5 (H) 11/21/2022   PLT 222 11/21/2022   Lab Results  Component Value Date   TSH 4.29 04/04/2022     Chemistry      Component Value Date/Time   NA 138 11/21/2022 0930   NA 140 10/31/2021 1502   K 3.7 11/21/2022 0930   CL 107 11/21/2022 0930   CO2 24 11/21/2022 0930   BUN 6 11/21/2022 0930   BUN 6 10/31/2021 1502   CREATININE 1.02 11/21/2022 0930      Component Value Date/Time   CALCIUM 9.5 11/21/2022 0930         Total time spent on today's visit was ***30 minutes, including both face-to-face time and nonface-to-face time.  Time included that spent on review of records (prior notes available to me/labs/imaging if pertinent), discussing treatment and goals, answering patient's questions and coordinating care.  Cc:  Hadley Pen, MD

## 2023-04-08 ENCOUNTER — Ambulatory Visit: Payer: Managed Care, Other (non HMO) | Admitting: Neurology

## 2023-08-28 ENCOUNTER — Other Ambulatory Visit: Payer: Self-pay

## 2023-08-28 ENCOUNTER — Emergency Department (HOSPITAL_COMMUNITY)
Admission: EM | Admit: 2023-08-28 | Discharge: 2023-08-29 | Disposition: A | Discharge status: Home / Self Care | Attending: Emergency Medicine | Admitting: Emergency Medicine

## 2023-08-28 ENCOUNTER — Emergency Department (HOSPITAL_COMMUNITY)

## 2023-08-28 ENCOUNTER — Encounter (HOSPITAL_COMMUNITY): Payer: Self-pay | Admitting: Emergency Medicine

## 2023-08-28 DIAGNOSIS — K2980 Duodenitis without bleeding: Secondary | ICD-10-CM | POA: Insufficient documentation

## 2023-08-28 DIAGNOSIS — J449 Chronic obstructive pulmonary disease, unspecified: Secondary | ICD-10-CM | POA: Diagnosis not present

## 2023-08-28 DIAGNOSIS — I2510 Atherosclerotic heart disease of native coronary artery without angina pectoris: Secondary | ICD-10-CM | POA: Diagnosis not present

## 2023-08-28 DIAGNOSIS — D72829 Elevated white blood cell count, unspecified: Secondary | ICD-10-CM | POA: Insufficient documentation

## 2023-08-28 DIAGNOSIS — Z7902 Long term (current) use of antithrombotics/antiplatelets: Secondary | ICD-10-CM | POA: Insufficient documentation

## 2023-08-28 DIAGNOSIS — R1031 Right lower quadrant pain: Secondary | ICD-10-CM | POA: Diagnosis present

## 2023-08-28 DIAGNOSIS — Z7982 Long term (current) use of aspirin: Secondary | ICD-10-CM | POA: Diagnosis not present

## 2023-08-28 LAB — CBC
HCT: 45.4 % (ref 39.0–52.0)
Hemoglobin: 14.6 g/dL (ref 13.0–17.0)
MCH: 32 pg (ref 26.0–34.0)
MCHC: 32.2 g/dL (ref 30.0–36.0)
MCV: 99.6 fL (ref 80.0–100.0)
Platelets: 307 10*3/uL (ref 150–400)
RBC: 4.56 MIL/uL (ref 4.22–5.81)
RDW: 12.3 % (ref 11.5–15.5)
WBC: 10.9 10*3/uL — ABNORMAL HIGH (ref 4.0–10.5)
nRBC: 0 % (ref 0.0–0.2)

## 2023-08-28 LAB — URINALYSIS, ROUTINE W REFLEX MICROSCOPIC
Bilirubin Urine: NEGATIVE
Glucose, UA: NEGATIVE mg/dL
Hgb urine dipstick: NEGATIVE
Ketones, ur: NEGATIVE mg/dL
Leukocytes,Ua: NEGATIVE
Nitrite: NEGATIVE
Protein, ur: NEGATIVE mg/dL
Specific Gravity, Urine: 1.004 — ABNORMAL LOW (ref 1.005–1.030)
pH: 8 (ref 5.0–8.0)

## 2023-08-28 LAB — COMPREHENSIVE METABOLIC PANEL
ALT: 8 U/L (ref 0–44)
AST: 14 U/L — ABNORMAL LOW (ref 15–41)
Albumin: 3.8 g/dL (ref 3.5–5.0)
Alkaline Phosphatase: 98 U/L (ref 38–126)
Anion gap: 11 (ref 5–15)
BUN: <5 mg/dL — ABNORMAL LOW (ref 6–20)
CO2: 23 mmol/L (ref 22–32)
Calcium: 9.9 mg/dL (ref 8.9–10.3)
Chloride: 108 mmol/L (ref 98–111)
Creatinine, Ser: 1.02 mg/dL (ref 0.61–1.24)
GFR, Estimated: >60 mL/min (ref >60)
Glucose, Bld: 114 mg/dL — ABNORMAL HIGH (ref 70–99)
Potassium: 4 mmol/L (ref 3.5–5.1)
Sodium: 142 mmol/L (ref 135–145)
Total Bilirubin: 0.8 mg/dL (ref 0.0–1.2)
Total Protein: 6.8 g/dL (ref 6.5–8.1)

## 2023-08-28 LAB — LIPASE, BLOOD: Lipase: 30 U/L (ref 11–51)

## 2023-08-28 MED ORDER — SODIUM CHLORIDE 0.9 % IV BOLUS
1000.0000 mL | Freq: Once | INTRAVENOUS | Status: AC
  Administered 2023-08-28: 1000 mL via INTRAVENOUS

## 2023-08-28 MED ORDER — MORPHINE SULFATE (PF) 4 MG/ML IV SOLN
4.0000 mg | Freq: Once | INTRAVENOUS | Status: AC
  Administered 2023-08-28: 4 mg via INTRAVENOUS
  Filled 2023-08-28: qty 1

## 2023-08-28 MED ORDER — ONDANSETRON HCL 4 MG/2ML IJ SOLN
4.0000 mg | Freq: Once | INTRAMUSCULAR | Status: AC
  Administered 2023-08-28: 4 mg via INTRAVENOUS
  Filled 2023-08-28: qty 2

## 2023-08-28 MED ORDER — IOHEXOL 350 MG/ML SOLN
75.0000 mL | Freq: Once | INTRAVENOUS | Status: AC | PRN
  Administered 2023-08-28: 75 mL via INTRAVENOUS

## 2023-08-28 NOTE — ED Provider Notes (Addendum)
 [place] EMERGENCY DEPARTMENT AT [facility] Provider Note   CSN: [id number] Arrival date & time: 08/28/23  2035     History  Chief Complaint  Patient presents with   Abdominal Pain   HPI CALLIN Richard Sanford is a 58 y.o. male with history of GERD, CAD, COPD, status post cholecystectomy presenting for abdominal pain.  Has been going on for couple of weeks but much worse in the last few days.  Pain is in the right lower quadrant and radiates to the middle of the abdomen at times.  Endorses nausea and vomiting but no diarrhea.  States he had a normal bowel meant yesterday.  Denies fever.  Reports a history of gastric ulcers.  States he takes something to "coat his stomach" but he cannot remember what it is.  Denies alcohol use.  Denies urinary symptoms.  Denies blood in the stool or vomit.   Abdominal Pain      Home Medications Prior to Admission medications   Medication Sig Start Date End Date Taking? Authorizing Provider  albuterol (VENTOLIN HFA) 108 (90 Base) MCG/ACT inhaler Inhale 2 puffs into the lungs every 6 (six) hours as needed for wheezing or shortness of breath. 04/27/22   [provider]  aspirin EC 81 MG tablet Take 1 tablet (81 mg total) by mouth daily. Swallow whole. 12/21/22   Jene Every, MD  cephALEXin (KEFLEX) 500 MG capsule Take 1 capsule (500 mg total) by mouth 4 (four) times daily. Starting 6 hours after scheduled surgery time 12/20/22   Jene Every, MD  clopidogrel (PLAVIX) 75 MG tablet Take 1 tablet (75 mg total) by mouth daily. Patient not taking: Reported on 11/21/2022 04/16/22   Georgeanna Lea, MD  docusate sodium (COLACE) 100 MG capsule Take 1 capsule (100 mg total) by mouth 2 (two) times daily as needed for mild constipation. 12/20/22   Jene Every, MD  gabapentin (NEURONTIN) 300 MG capsule Take 600 mg by mouth 2 (two) times daily.    [provider]  lamoTRIgine (LAMICTAL) 200 MG tablet Take 200 mg by mouth 2 (two) times  daily.    [provider]  lithium carbonate 300 MG capsule Take 600 mg by mouth 2 (two) times daily with a meal.    [provider]  methocarbamol (ROBAXIN) 500 MG tablet Take 1 tablet (500 mg total) by mouth every 8 (eight) hours as needed for muscle spasms. 12/20/22   Jene Every, MD  metoprolol tartrate (LOPRESSOR) 25 MG tablet Take 1 tablet (25 mg total) by mouth 2 (two) times daily. Patient not taking: Reported on 11/21/2022 01/22/22   Georgeanna Lea, MD  nitroGLYCERIN (NITROSTAT) 0.4 MG SL tablet Place 1 tablet (0.4 mg total) under the tongue every 5 (five) minutes as needed for chest pain. Do not use if you have taken Cialis within 48 hours. Patient not taking: Reported on 11/21/2022 11/01/21   End, Cristal Deer, MD  oxyCODONE (OXY IR/ROXICODONE) 5 MG immediate release tablet Take 1 tablet (5 mg total) by mouth every 4 (four) hours as needed for severe pain. 12/20/22   Jene Every, MD  pantoprazole (PROTONIX) 40 MG tablet Take 1 tablet (40 mg total) by mouth daily. 08/29/23 09/28/23  Gareth Eagle, PA-C  polyethylene glycol (MIRALAX / GLYCOLAX) 17 g packet Take 17 g by mouth daily. 12/20/22   Jene Every, MD  PRESCRIPTION MEDICATION Inject 1 Dose as directed as needed (erectile dysfunction). Unknown Injectable erectile dysfunction medication    [provider]  ranolazine (RANEXA) 500 MG 12 hr tablet Take 1 tablet (500 mg total) by mouth 2 (two) times daily. Patient not taking: Reported on 11/21/2022 04/16/22   Georgeanna Lea, MD  sildenafil (VIAGRA) 50 MG tablet Take 50 mg by mouth as needed for erectile dysfunction. Patient not taking: Reported on 11/21/2022 05/01/22   [provider]  sucralfate (CARAFATE) 1 g tablet Take 1 tablet (1 g total) by mouth 4 (four) times daily -  with meals and at bedtime for 15 days. 08/29/23 09/13/23  Gareth Eagle, PA-C  VITAMIN D PO Take 1 capsule by mouth daily.    [provider]  VITAMIN E PO Take 1  capsule by mouth daily.    [provider]      Allergies    Penicillins, Nsaids, and Tolmetin    Review of Systems   Review of Systems  Gastrointestinal:  Positive for abdominal pain.    Physical Exam Updated Vital Signs BP 137/81 (BP Location: Left Arm)   Pulse 70   Temp 98.8 F (37.1 C) (Oral)   Resp 19   Ht 6\' 1"  (1.854 m)   Wt 65.8 kg   SpO2 97%   BMI 19.13 kg/m  Physical Exam Vitals and nursing note reviewed.  HENT:     Head: Normocephalic and atraumatic.     Mouth/Throat:     Mouth: Mucous membranes are moist.  Eyes:     General:        Right eye: No discharge.        Left eye: No discharge.     Conjunctiva/sclera: Conjunctivae normal.  Cardiovascular:     Rate and Rhythm: Normal rate and regular rhythm.     Pulses: Normal pulses.     Heart sounds: Normal heart sounds.  Pulmonary:     Effort: Pulmonary effort is normal.     Breath sounds: Normal breath sounds.  Abdominal:     General: Abdomen is flat.     Palpations: Abdomen is soft.     Tenderness: There is abdominal tenderness in the right lower quadrant, epigastric area and periumbilical area.  Skin:    General: Skin is warm and dry.  Neurological:     General: No focal deficit present.  Psychiatric:        Mood and Affect: Mood normal.     ED Results / Procedures / Treatments   Labs (all labs ordered are listed, but only abnormal results are displayed) Labs Reviewed  COMPREHENSIVE METABOLIC PANEL - Abnormal; Notable for the following components:      Result Value   Glucose, Bld 114 (*)    BUN <5 (*)    AST 14 (*)    All other components within normal limits  CBC - Abnormal; Notable for the following components:   WBC 10.9 (*)    All other components within normal limits  URINALYSIS, ROUTINE W REFLEX MICROSCOPIC - Abnormal; Notable for the following components:   Color, Urine STRAW (*)    Specific Gravity, Urine 1.004 (*)    All other components within normal limits  LIPASE,  BLOOD    EKG None  Radiology CT ABDOMEN PELVIS W CONTRAST Result Date: 08/29/2023 CLINICAL DATA:  Right lower quadrant pain EXAM: CT ABDOMEN AND PELVIS WITH CONTRAST TECHNIQUE: Multidetector CT imaging of the abdomen and pelvis was performed using the standard protocol following bolus administration of intravenous contrast. RADIATION DOSE REDUCTION: This exam was performed according to the departmental dose-optimization program which includes  automated exposure control, adjustment of the mA and/or kV according to patient size and/or use of iterative reconstruction technique. CONTRAST:  75mL OMNIPAQUE IOHEXOL 350 MG/ML SOLN COMPARISON:  None Available. FINDINGS: Lower chest: Lung bases are free of acute infiltrate or sizable effusion. Hepatobiliary: No focal liver abnormality is seen. Status post cholecystectomy. No biliary dilatation. Pancreas: Unremarkable. No pancreatic ductal dilatation or surrounding inflammatory changes. Spleen: Normal in size without focal abnormality. Adrenals/Urinary Tract: Adrenal glands are within normal limits. Kidneys demonstrate a normal enhancement pattern bilaterally. Focal nonobstructing left lower pole stone is noted measuring 7 mm. Bladder is within normal limits. No obstructive changes are seen. Stomach/Bowel: Scattered fecal material is noted throughout the colon. The appendix is within normal limits. Stomach is within normal limits. There are Peri duodenal inflammatory changes consistent with focal duodenitis. No findings to suggest perforated ulcer are seen at this time. Vascular/Lymphatic: Aortic atherosclerosis. No enlarged abdominal or pelvic lymph nodes. Reproductive: Prostate is unremarkable. Other: No abdominal wall hernia or abnormality. No abdominopelvic ascites. Musculoskeletal: No acute or significant osseous findings. IMPRESSION: Nonobstructing left renal calculus. Inflammatory changes surrounding the duodenum consistent with focal duodenitis. No findings to  suggest perforated ulcer are seen. Electronically Signed   By: Alcide Clever M.D.   On: 08/29/2023 01:41    Procedures Procedures    Medications Ordered in ED Medications  pantoprazole (PROTONIX) EC tablet 40 mg (has no administration in time range)  sodium chloride 0.9 % bolus 1,000 mL (0 mLs Intravenous Stopped 08/29/23 0056)  ondansetron (ZOFRAN) injection 4 mg (4 mg Intravenous Given 08/28/23 2345)  morphine (PF) 4 MG/ML injection 4 mg (4 mg Intravenous Given 08/28/23 2345)  iohexol (OMNIPAQUE) 350 MG/ML injection 75 mL (75 mLs Intravenous Contrast Given 08/28/23 2338)  HYDROmorphone (DILAUDID) injection 1 mg (1 mg Intravenous Given 08/29/23 0101)    ED Course/ Medical Decision Making/ A&P Clinical Course as of 08/29/23 0246  Fri Aug 29, 2023  0158 CT ABDOMEN PELVIS W CONTRAST [JR]    Clinical Course User Index [JR] Gareth Eagle, PA-C                                 Medical Decision Making Amount and/or Complexity of Data Reviewed Labs: ordered. Radiology: ordered. Decision-making details documented in ED Course.  Risk Prescription drug management.   Initial Impression and Ddx 58 year old well-appearing male presenting for abdominal pain.  Exam notable for generalized abdominal tenderness.  DDx includes acute pancreatitis, appendicitis, diverticulitis, perforated viscus, kidney stone, UTI, other. Patient PMH that increases complexity of ED encounter: Reported history of gastric ulcer, GERD, CAD, COPD, status post cholecystectomy  Interpretation of Diagnostics - I independent reviewed and interpreted the labs as followed: Leukocytosis  - I independently visualized the following imaging with scope of interpretation limited to determining acute life threatening conditions related to emergency care: CT abdomen/pelvis, which revealed findings consistent with duodenitis and a nonobstructing renal calculus on the left side  Patient Reassessment and Ultimate  Disposition/Management On reassessment, abdominal pain had improved.  Suspect duodenitis is likely contributing to his symptoms.  Advised him to take his PPI twice daily, added Carafate and advised him to follow-up with GI.  Fluid challenge without complication.  Discussed return precautions.  Discharged in good condition.  Patient management required discussion with the following services or consulting groups:  None  Complexity of Problems Addressed Acute complicated illness or Injury  Additional Data Reviewed and Analyzed Further  history obtained from: Past medical history and medications listed in the EMR and Prior ED visit notes  Patient Encounter Risk Assessment Prescriptions      Final Clinical Impression(s) / ED Diagnoses Final diagnoses:  Duodenitis    Rx / DC Orders ED Discharge Orders          Ordered    sucralfate (CARAFATE) 1 g tablet  3 times daily with meals & bedtime,   Status:  Discontinued        08/29/23 0237    sucralfate (CARAFATE) 1 g tablet  3 times daily with meals & bedtime        08/29/23 0246    pantoprazole (PROTONIX) 40 MG tablet  Daily        08/29/23 0246              Gareth Eagle, PA-C 08/29/23 0246    Tilden Fossa, MD 08/29/23 [phone]

## 2023-08-28 NOTE — ED Triage Notes (Addendum)
 Patient complaining of generalized abdominal pain, worse in RLQ that has been ongoing for weeks but worsened today. Seen at [facility] and sent here for further eval. Hx of stomach ulcers but states pain feels different. Attempted prescribed medications at home with no relief. Report n/v.

## 2023-08-29 MED ORDER — PANTOPRAZOLE SODIUM 40 MG PO TBEC
40.0000 mg | DELAYED_RELEASE_TABLET | Freq: Every day | ORAL | Status: DC
  Administered 2023-08-29: 40 mg via ORAL
  Filled 2023-08-29: qty 1

## 2023-08-29 MED ORDER — HYDROMORPHONE HCL 1 MG/ML IJ SOLN
1.0000 mg | Freq: Once | INTRAMUSCULAR | Status: AC
  Administered 2023-08-29: 1 mg via INTRAVENOUS
  Filled 2023-08-29: qty 1

## 2023-08-29 MED ORDER — SUCRALFATE 1 G PO TABS: 1.0000 g | ORAL_TABLET | Freq: Three times a day (TID) | ORAL | 0 refills | Status: AC

## 2023-08-29 MED ORDER — PANTOPRAZOLE SODIUM 40 MG PO TBEC: 40.0000 mg | DELAYED_RELEASE_TABLET | Freq: Every day | ORAL | 0 refills | Status: AC

## 2023-08-29 MED ORDER — SUCRALFATE 1 G PO TABS: 1.0000 g | ORAL_TABLET | Freq: Three times a day (TID) | ORAL | 0 refills | Status: DC

## 2023-08-29 NOTE — Discharge Instructions (Addendum)
 Evaluation today revealed that you have inflammation of your duodenum.  This is likely causing your symptoms.  I recommend that you take your Protonix twice daily and I have added Carafate to help coat the lining of the stomach.  Advised to follow-up with GI.  If you have worsening abdominal pain, blood in the vomit or stool, inability to tolerate fluid intake, develop a fever or any other concerning symptom please return to the emergency department further evaluation.
